# Patient Record
Sex: Male | Born: 1950 | Race: White | Marital: Married | Smoking: Former smoker
Health system: Southern US, Community
[De-identification: ages and names within clinical notes are randomized; demographics above are authoritative.]

## PROBLEM LIST (undated history)

## (undated) DIAGNOSIS — Z87442 Personal history of urinary calculi: Secondary | ICD-10-CM

## (undated) DIAGNOSIS — E7800 Pure hypercholesterolemia, unspecified: Secondary | ICD-10-CM

## (undated) DIAGNOSIS — G709 Myoneural disorder, unspecified: Secondary | ICD-10-CM

## (undated) DIAGNOSIS — K219 Gastro-esophageal reflux disease without esophagitis: Secondary | ICD-10-CM

## (undated) DIAGNOSIS — M1990 Unspecified osteoarthritis, unspecified site: Secondary | ICD-10-CM

## (undated) DIAGNOSIS — J189 Pneumonia, unspecified organism: Secondary | ICD-10-CM

## (undated) HISTORY — PX: BACK SURGERY: SHX140

## (undated) HISTORY — PX: ESOPHAGEAL RECONSTRUCTION: SHX1527

## (undated) HISTORY — PX: TONSILLECTOMY: SUR1361

## (undated) HISTORY — PX: ANTERIOR CERVICAL DECOMP/DISCECTOMY FUSION: SHX1161

---

## 2017-05-25 ENCOUNTER — Other Ambulatory Visit: Payer: Self-pay | Admitting: Orthopaedic Surgery

## 2017-05-25 DIAGNOSIS — M545 Low back pain: Secondary | ICD-10-CM

## 2017-06-02 ENCOUNTER — Other Ambulatory Visit: Payer: Self-pay | Admitting: Orthopaedic Surgery

## 2017-06-02 ENCOUNTER — Ambulatory Visit
Admission: RE | Admit: 2017-06-02 | Discharge: 2017-06-02 | Disposition: A | Discharge status: Home / Self Care | Payer: Medicare Other | Source: Ambulatory Visit | Attending: Orthopaedic Surgery | Admitting: Orthopaedic Surgery

## 2017-06-02 DIAGNOSIS — M545 Low back pain: Secondary | ICD-10-CM

## 2018-08-21 ENCOUNTER — Emergency Department (HOSPITAL_BASED_OUTPATIENT_CLINIC_OR_DEPARTMENT_OTHER)
Admission: EM | Admit: 2018-08-21 | Discharge: 2018-08-21 | Disposition: A | Discharge status: Home / Self Care | Payer: Medicare Other | Attending: Emergency Medicine | Admitting: Emergency Medicine

## 2018-08-21 ENCOUNTER — Encounter (HOSPITAL_BASED_OUTPATIENT_CLINIC_OR_DEPARTMENT_OTHER): Payer: Self-pay | Admitting: *Deleted

## 2018-08-21 ENCOUNTER — Emergency Department (HOSPITAL_BASED_OUTPATIENT_CLINIC_OR_DEPARTMENT_OTHER): Payer: Medicare Other

## 2018-08-21 ENCOUNTER — Other Ambulatory Visit: Payer: Self-pay

## 2018-08-21 DIAGNOSIS — N201 Calculus of ureter: Secondary | ICD-10-CM | POA: Diagnosis not present

## 2018-08-21 DIAGNOSIS — R109 Unspecified abdominal pain: Secondary | ICD-10-CM | POA: Diagnosis present

## 2018-08-21 DIAGNOSIS — R112 Nausea with vomiting, unspecified: Secondary | ICD-10-CM | POA: Diagnosis not present

## 2018-08-21 DIAGNOSIS — Z79899 Other long term (current) drug therapy: Secondary | ICD-10-CM | POA: Diagnosis not present

## 2018-08-21 HISTORY — DX: Pure hypercholesterolemia, unspecified: E78.00

## 2018-08-21 LAB — BASIC METABOLIC PANEL
Anion gap: 11 (ref 5–15)
BUN: 21 mg/dL (ref 8–23)
CHLORIDE: 104 mmol/L (ref 98–111)
CO2: 25 mmol/L (ref 22–32)
Calcium: 8.9 mg/dL (ref 8.9–10.3)
Creatinine, Ser: 1.28 mg/dL — ABNORMAL HIGH (ref 0.61–1.24)
GFR calc Af Amer: >60 mL/min (ref >60)
GFR, EST NON AFRICAN AMERICAN: 56 mL/min — AB (ref >60)
GLUCOSE: 136 mg/dL — AB (ref 70–99)
POTASSIUM: 3.9 mmol/L (ref 3.5–5.1)
Sodium: 140 mmol/L (ref 135–145)

## 2018-08-21 LAB — CBC WITH DIFFERENTIAL/PLATELET
Basophils Absolute: 0 10*3/uL (ref 0.0–0.1)
Basophils Relative: 0 %
EOS PCT: 1 %
Eosinophils Absolute: 0.1 10*3/uL (ref 0.0–0.7)
HCT: 46.8 % (ref 39.0–52.0)
Hemoglobin: 15.6 g/dL (ref 13.0–17.0)
LYMPHS PCT: 12 %
Lymphs Abs: 1.3 10*3/uL (ref 0.7–4.0)
MCH: 30.8 pg (ref 26.0–34.0)
MCHC: 33.3 g/dL (ref 30.0–36.0)
MCV: 92.3 fL (ref 78.0–100.0)
MONO ABS: 0.8 10*3/uL (ref 0.1–1.0)
MONOS PCT: 7 %
Neutro Abs: 8.9 10*3/uL — ABNORMAL HIGH (ref 1.7–7.7)
Neutrophils Relative %: 80 %
PLATELETS: 273 10*3/uL (ref 150–400)
RBC: 5.07 MIL/uL (ref 4.22–5.81)
RDW: 13.7 % (ref 11.5–15.5)
WBC: 11.1 10*3/uL — ABNORMAL HIGH (ref 4.0–10.5)

## 2018-08-21 LAB — URINALYSIS, ROUTINE W REFLEX MICROSCOPIC
BILIRUBIN URINE: NEGATIVE
Glucose, UA: NEGATIVE mg/dL
KETONES UR: NEGATIVE mg/dL
NITRITE: NEGATIVE
PROTEIN: NEGATIVE mg/dL
Specific Gravity, Urine: >1.03 — ABNORMAL HIGH (ref 1.005–1.030)
pH: 5 (ref 5.0–8.0)

## 2018-08-21 LAB — URINALYSIS, MICROSCOPIC (REFLEX)

## 2018-08-21 MED ORDER — ONDANSETRON HCL 4 MG/2ML IJ SOLN
4.0000 mg | Freq: Once | INTRAMUSCULAR | Status: AC
  Administered 2018-08-21: 4 mg via INTRAVENOUS
  Filled 2018-08-21: qty 2

## 2018-08-21 MED ORDER — HYDROMORPHONE HCL 1 MG/ML IJ SOLN
1.0000 mg | Freq: Once | INTRAMUSCULAR | Status: AC
  Administered 2018-08-21 (×2): 1 mg via INTRAVENOUS
  Filled 2018-08-21: qty 1

## 2018-08-21 MED ORDER — SODIUM CHLORIDE 0.9 % IV BOLUS
1000.0000 mL | Freq: Once | INTRAVENOUS | Status: AC
  Administered 2018-08-21: 1000 mL via INTRAVENOUS

## 2018-08-21 MED ORDER — TAMSULOSIN HCL 0.4 MG PO CAPS: 0.4000 mg | ORAL_CAPSULE | Freq: Every day | ORAL | 0 refills | Status: DC

## 2018-08-21 MED ORDER — HYDROMORPHONE HCL 1 MG/ML IJ SOLN: 1.0000 mg | Freq: Once | INTRAMUSCULAR | Status: DC

## 2018-08-21 MED ORDER — ONDANSETRON 4 MG PO TBDP: 4.0000 mg | ORAL_TABLET | Freq: Three times a day (TID) | ORAL | 0 refills | Status: DC | PRN

## 2018-08-21 MED ORDER — KETOROLAC TROMETHAMINE 30 MG/ML IJ SOLN
30.0000 mg | Freq: Once | INTRAMUSCULAR | Status: AC
  Administered 2018-08-21: 30 mg via INTRAVENOUS
  Filled 2018-08-21: qty 1

## 2018-08-21 MED ORDER — OXYCODONE-ACETAMINOPHEN 5-325 MG PO TABS: 1.0000 | ORAL_TABLET | ORAL | 0 refills | Status: DC | PRN

## 2018-08-21 MED ORDER — HYDROMORPHONE HCL 1 MG/ML IJ SOLN
INTRAMUSCULAR | Status: AC
  Administered 2018-08-21: 1 mg via INTRAVENOUS
  Filled 2018-08-21: qty 1

## 2018-08-21 NOTE — ED Notes (Signed)
ED Provider at bedside. 

## 2018-08-21 NOTE — ED Triage Notes (Signed)
Left flank pain. Pale. He feels like he has a kidney stone.

## 2018-08-21 NOTE — ED Provider Notes (Signed)
[facility] EMERGENCY DEPARTMENT Provider Note   CSN: [id number] Arrival date & time: 08/21/18  1147     History   Chief Complaint Chief Complaint  Patient presents with  . Flank Pain    HPI Adrian Byrd is a 67 y.o. male.  Pt presents to the ED today with a sudden onset of left flank pain.  Pt has never had a kidney stone, but thinks he may have one now.  He said pain radiates to his left groin.  N/v.     Past Medical History:  Diagnosis Date  . High cholesterol     There are no active problems to display for this patient.   History reviewed. No pertinent surgical history.      Home Medications    Prior to Admission medications   Medication Sig Start Date End Date Taking? Authorizing Provider  ATORVASTATIN CALCIUM PO Take by mouth.   Yes [provider]  BACLOFEN PO Take by mouth.   Yes [provider]  GABAPENTIN PO Take by mouth.   Yes [provider]  Omeprazole (PRILOSEC PO) Take by mouth.   Yes [provider]  ondansetron (ZOFRAN ODT) 4 MG disintegrating tablet Take 1 tablet (4 mg total) by mouth every 8 (eight) hours as needed. 08/21/18   Jacalyn Lefevre, MD  oxyCODONE-acetaminophen (PERCOCET/ROXICET) 5-325 MG tablet Take 1-2 tablets by mouth every 4 (four) hours as needed for severe pain. 08/21/18   Jacalyn Lefevre, MD  tamsulosin (FLOMAX) 0.4 MG CAPS capsule Take 1 capsule (0.4 mg total) by mouth daily. 08/21/18   Jacalyn Lefevre, MD    Family History No family history on file.  Social History Social History   Tobacco Use  . Smoking status: Not on file  Substance Use Topics  . Alcohol use: Not on file  . Drug use: Not on file     Allergies   Patient has no known allergies.   Review of Systems Review of Systems  Gastrointestinal: Positive for nausea and vomiting.  Genitourinary: Positive for flank pain.  All other systems reviewed and are negative.    Physical Exam Updated Vital Signs BP  98/72 (BP Location: Right Arm)   Pulse 64   Temp 97.7 F (36.5 C) (Oral)   Resp 20   Ht 6' (1.829 m)   Wt 111.1 kg   SpO2 95%   BMI 33.23 kg/m   Physical Exam  Constitutional: He is oriented to person, place, and time. He appears well-developed. He appears distressed.  Pt writhing on bed appearing very uncomfortable.  HENT:  Head: Normocephalic and atraumatic.  Right Ear: External ear normal.  Left Ear: External ear normal.  Nose: Nose normal.  Mouth/Throat: Oropharynx is clear and moist.  Eyes: Pupils are equal, round, and reactive to light. Conjunctivae and EOM are normal.  Neck: Normal range of motion. Neck supple.  Cardiovascular: Normal rate, regular rhythm, normal heart sounds and intact distal pulses.  Pulmonary/Chest: Effort normal and breath sounds normal.  Abdominal: Soft. Bowel sounds are normal.  Musculoskeletal: Normal range of motion.  Neurological: He is alert and oriented to person, place, and time.  Skin: Skin is warm. Capillary refill takes less than 2 seconds.  Psychiatric: He has a normal mood and affect. His behavior is normal. Judgment and thought content normal.  Nursing note and vitals reviewed.    ED Treatments / Results  Labs (all labs ordered are listed, but only abnormal results are displayed) Labs Reviewed  URINALYSIS, ROUTINE  W REFLEX MICROSCOPIC - Abnormal; Notable for the following components:      Result Value   APPearance HAZY (*)    Specific Gravity, Urine >1.030 (*)    Hgb urine dipstick TRACE (*)    Leukocytes, UA TRACE (*)    All other components within normal limits  BASIC METABOLIC PANEL - Abnormal; Notable for the following components:   Glucose, Bld 136 (*)    Creatinine, Ser 1.28 (*)    GFR calc non Af Amer 56 (*)    All other components within normal limits  CBC WITH DIFFERENTIAL/PLATELET - Abnormal; Notable for the following components:   WBC 11.1 (*)    Neutro Abs 8.9 (*)    All other components within normal limits    URINALYSIS, MICROSCOPIC (REFLEX) - Abnormal; Notable for the following components:   Bacteria, UA FEW (*)    All other components within normal limits    EKG None  Radiology Ct Renal Stone Study  Result Date: 08/21/2018 CLINICAL DATA:  Left flank and groin pain. EXAM: CT ABDOMEN AND PELVIS WITHOUT CONTRAST TECHNIQUE: Multidetector CT imaging of the abdomen and pelvis was performed following the standard protocol without IV contrast. COMPARISON:  None. FINDINGS: Lower chest: No acute abnormality. Hepatobiliary: No focal liver abnormality is seen. No gallstones, gallbladder wall thickening, or biliary dilatation. Pancreas: Unremarkable. No pancreatic ductal dilatation or surrounding inflammatory changes. Spleen: Normal in size without focal abnormality. Adrenals/Urinary Tract: There is a 5 mm stone obstructing the distal left ureter at the ureterovesical junction creating slight left hydronephrosis with left perinephric soft tissue stranding. There is a 2 mm stone in the distal left ureter approximately 15 mm proximal to the more distal stones. There is a 13 mm stone in the lower pole of the left kidney. 2 mm stone in the lower pole of the right kidney. 8 mm stone in the proximal right ureter creating mild right hydronephrosis. Stomach/Bowel: Multiple diverticula in the left side of the colon without diverticulitis. Small paraesophageal hiatal hernia. 10 mm lymph node adjacent to the distal esophagus on image 10 of series 2, nonspecific. Vascular/Lymphatic: Aortic atherosclerosis. No enlarged lymph nodes in the abdomen or pelvis. Reproductive: Prostate is unremarkable. Other: No abdominal wall hernia or abnormality. No abdominopelvic ascites. Musculoskeletal: No acute abnormalities. Multilevel degenerative disc disease in the lumbar spine. Osteophytes fuse multiple levels in the lower thoracic and upper lumbar spine. IMPRESSION: 1. 5 mm stone obstructing the distal left ureter at the left ureterovesical  junction. 2. 2 mm stone in the distal left ureter just proximal to the more distal stones. 3. 13 mm stone in the lower pole the left kidney. 4. 8 mm stone partially obstructing the proximal right ureter creating mild right hydronephrosis. 5. 2 mm stone in the lower pole of the right kidney. 6.  Aortic Atherosclerosis (ICD10-I70.0). 7. Small paraesophageal hernia with an adjacent slightly prominent lymph node of indeterminate etiology. Electronically Signed   By: Francene Boyers M.D.   On: 08/21/2018 12:54    Procedures Procedures (including critical care time)  Medications Ordered in ED Medications  HYDROmorphone (DILAUDID) injection 1 mg (1 mg Intravenous Not Given 08/21/18 1307)  HYDROmorphone (DILAUDID) injection 1 mg (1 mg Intravenous Given 08/21/18 1304)  ondansetron (ZOFRAN) injection 4 mg (4 mg Intravenous Given 08/21/18 1233)  sodium chloride 0.9 % bolus 1,000 mL (0 mLs Intravenous Stopped 08/21/18 1351)  ketorolac (TORADOL) 30 MG/ML injection 30 mg (30 mg Intravenous Given 08/21/18 1350)     Initial Impression /  Assessment and Plan / ED Course  I have reviewed the triage vital signs and the nursing notes.  Pertinent labs & imaging results that were available during my care of the patient were reviewed by me and considered in my medical decision making (see chart for details).    Pt is feeling much better.  Pain is gone after a total of 2 mg dilaudid and 30 mg toradol.  He is given the number of urology.  He is instructed to return if worse.   Final Clinical Impressions(s) / ED Diagnoses   Final diagnoses:  Ureterolithiasis    ED Discharge Orders         Ordered    oxyCODONE-acetaminophen (PERCOCET/ROXICET) 5-325 MG tablet  Every 4 hours PRN     08/21/18 1407    ondansetron (ZOFRAN ODT) 4 MG disintegrating tablet  Every 8 hours PRN     08/21/18 1407    tamsulosin (FLOMAX) 0.4 MG CAPS capsule  Daily     08/21/18 1407           Jacalyn Lefevre, MD 08/21/18 1409

## 2018-08-22 ENCOUNTER — Ambulatory Visit (HOSPITAL_COMMUNITY): Payer: Medicare Other | Admitting: Certified Registered Nurse Anesthetist

## 2018-08-22 ENCOUNTER — Encounter (HOSPITAL_COMMUNITY)
Admission: RE | Disposition: A | Discharge status: Home / Self Care | Payer: Self-pay | Source: Ambulatory Visit | Attending: Urology

## 2018-08-22 ENCOUNTER — Encounter (HOSPITAL_COMMUNITY): Payer: Self-pay | Admitting: *Deleted

## 2018-08-22 ENCOUNTER — Ambulatory Visit (HOSPITAL_COMMUNITY): Payer: Medicare Other

## 2018-08-22 ENCOUNTER — Ambulatory Visit (HOSPITAL_COMMUNITY)
Admission: RE | Admit: 2018-08-22 | Discharge: 2018-08-22 | Disposition: A | Discharge status: Home / Self Care | Payer: Medicare Other | Source: Ambulatory Visit | Attending: Urology | Admitting: Urology

## 2018-08-22 ENCOUNTER — Other Ambulatory Visit: Payer: Self-pay | Admitting: Urology

## 2018-08-22 DIAGNOSIS — E669 Obesity, unspecified: Secondary | ICD-10-CM | POA: Insufficient documentation

## 2018-08-22 DIAGNOSIS — E785 Hyperlipidemia, unspecified: Secondary | ICD-10-CM | POA: Diagnosis not present

## 2018-08-22 DIAGNOSIS — M545 Low back pain: Secondary | ICD-10-CM | POA: Insufficient documentation

## 2018-08-22 DIAGNOSIS — Z87891 Personal history of nicotine dependence: Secondary | ICD-10-CM | POA: Insufficient documentation

## 2018-08-22 DIAGNOSIS — Z825 Family history of asthma and other chronic lower respiratory diseases: Secondary | ICD-10-CM | POA: Diagnosis not present

## 2018-08-22 DIAGNOSIS — Z6832 Body mass index (BMI) 32.0-32.9, adult: Secondary | ICD-10-CM | POA: Insufficient documentation

## 2018-08-22 DIAGNOSIS — Z79899 Other long term (current) drug therapy: Secondary | ICD-10-CM | POA: Diagnosis not present

## 2018-08-22 DIAGNOSIS — G629 Polyneuropathy, unspecified: Secondary | ICD-10-CM | POA: Diagnosis not present

## 2018-08-22 DIAGNOSIS — N132 Hydronephrosis with renal and ureteral calculous obstruction: Secondary | ICD-10-CM | POA: Insufficient documentation

## 2018-08-22 DIAGNOSIS — G8929 Other chronic pain: Secondary | ICD-10-CM | POA: Insufficient documentation

## 2018-08-22 DIAGNOSIS — K219 Gastro-esophageal reflux disease without esophagitis: Secondary | ICD-10-CM | POA: Diagnosis not present

## 2018-08-22 HISTORY — DX: Gastro-esophageal reflux disease without esophagitis: K21.9

## 2018-08-22 HISTORY — PX: CYSTOSCOPY/RETROGRADE/URETEROSCOPY: SHX5316

## 2018-08-22 SURGERY — CYSTOSCOPY/RETROGRADE/URETEROSCOPY
Anesthesia: General | Laterality: Bilateral

## 2018-08-22 MED ORDER — HYDROCODONE-ACETAMINOPHEN 7.5-325 MG PO TABS: 1.0000 | ORAL_TABLET | Freq: Once | ORAL | Status: DC | PRN

## 2018-08-22 MED ORDER — CEFAZOLIN SODIUM-DEXTROSE 2-3 GM-%(50ML) IV SOLR
INTRAVENOUS | Status: DC | PRN
  Administered 2018-08-22: 2 g via INTRAVENOUS

## 2018-08-22 MED ORDER — DEXAMETHASONE SODIUM PHOSPHATE 4 MG/ML IJ SOLN
INTRAMUSCULAR | Status: DC | PRN
  Administered 2018-08-22: 10 mg via INTRAVENOUS

## 2018-08-22 MED ORDER — DEXAMETHASONE SODIUM PHOSPHATE 10 MG/ML IJ SOLN
INTRAMUSCULAR | Status: AC
  Filled 2018-08-22: qty 1

## 2018-08-22 MED ORDER — FENTANYL CITRATE (PF) 100 MCG/2ML IJ SOLN
INTRAMUSCULAR | Status: DC | PRN
  Administered 2018-08-22: 25 ug via INTRAVENOUS
  Administered 2018-08-22: 50 ug via INTRAVENOUS
  Administered 2018-08-22: 25 ug via INTRAVENOUS

## 2018-08-22 MED ORDER — SODIUM CHLORIDE 0.9 % IR SOLN
Status: DC | PRN
  Administered 2018-08-22: 3000 mL

## 2018-08-22 MED ORDER — LIDOCAINE 2% (20 MG/ML) 5 ML SYRINGE
INTRAMUSCULAR | Status: AC
  Filled 2018-08-22: qty 5

## 2018-08-22 MED ORDER — IOHEXOL 300 MG/ML  SOLN
INTRAMUSCULAR | Status: DC | PRN
  Administered 2018-08-22: 15 mL via URETHRAL

## 2018-08-22 MED ORDER — LACTATED RINGERS IV SOLN
INTRAVENOUS | Status: DC
  Administered 2018-08-22: 15:00:00 via INTRAVENOUS

## 2018-08-22 MED ORDER — ONDANSETRON HCL 4 MG/2ML IJ SOLN: 4.0000 mg | Freq: Once | INTRAMUSCULAR | Status: DC | PRN

## 2018-08-22 MED ORDER — MEPERIDINE HCL 50 MG/ML IJ SOLN: 6.2500 mg | INTRAMUSCULAR | Status: DC | PRN

## 2018-08-22 MED ORDER — PROPOFOL 10 MG/ML IV BOLUS
INTRAVENOUS | Status: AC
  Filled 2018-08-22: qty 40

## 2018-08-22 MED ORDER — LIDOCAINE 2% (20 MG/ML) 5 ML SYRINGE
INTRAMUSCULAR | Status: DC | PRN
  Administered 2018-08-22: 60 mg via INTRAVENOUS

## 2018-08-22 MED ORDER — ONDANSETRON HCL 4 MG/2ML IJ SOLN
INTRAMUSCULAR | Status: AC
  Filled 2018-08-22: qty 2

## 2018-08-22 MED ORDER — MIDAZOLAM HCL 2 MG/2ML IJ SOLN
INTRAMUSCULAR | Status: DC | PRN
  Administered 2018-08-22: 2 mg via INTRAVENOUS

## 2018-08-22 MED ORDER — HYDROMORPHONE HCL 1 MG/ML IJ SOLN: 0.2500 mg | INTRAMUSCULAR | Status: DC | PRN

## 2018-08-22 MED ORDER — CEFAZOLIN SODIUM-DEXTROSE 2-4 GM/100ML-% IV SOLN
INTRAVENOUS | Status: AC
  Filled 2018-08-22: qty 100

## 2018-08-22 MED ORDER — FENTANYL CITRATE (PF) 100 MCG/2ML IJ SOLN
INTRAMUSCULAR | Status: AC
  Filled 2018-08-22: qty 2

## 2018-08-22 MED ORDER — ONDANSETRON HCL 4 MG/2ML IJ SOLN
INTRAMUSCULAR | Status: DC | PRN
  Administered 2018-08-22: 4 mg via INTRAVENOUS

## 2018-08-22 MED ORDER — MIDAZOLAM HCL 2 MG/2ML IJ SOLN
INTRAMUSCULAR | Status: AC
  Filled 2018-08-22: qty 2

## 2018-08-22 MED ORDER — PROPOFOL 10 MG/ML IV BOLUS
INTRAVENOUS | Status: DC | PRN
  Administered 2018-08-22: 150 mg via INTRAVENOUS

## 2018-08-22 SURGICAL SUPPLY — 24 items
BAG URO CATCHER STRL LF (MISCELLANEOUS) ×2 IMPLANT
BASKET LASER NITINOL 1.9FR (BASKET) IMPLANT
BASKET ZERO TIP NITINOL 2.4FR (BASKET) IMPLANT
CATH INTERMIT  6FR 70CM (CATHETERS) ×2 IMPLANT
CATH URET 5FR 28IN CONE TIP (BALLOONS)
CATH URET 5FR 70CM CONE TIP (BALLOONS) IMPLANT
CLOTH BEACON ORANGE TIMEOUT ST (SAFETY) ×2 IMPLANT
EXTRACTOR STONE 1.7FRX115CM (UROLOGICAL SUPPLIES) IMPLANT
FIBER LASER FLEXIVA 365 (UROLOGICAL SUPPLIES) IMPLANT
FIBER LASER TRAC TIP (UROLOGICAL SUPPLIES) ×2 IMPLANT
GLOVE BIO SURGEON STRL SZ7.5 (GLOVE) ×2 IMPLANT
GLOVE BIOGEL M STRL SZ7.5 (GLOVE) ×2 IMPLANT
GLOVE BIOGEL PI IND STRL 7.5 (GLOVE) ×1 IMPLANT
GLOVE BIOGEL PI INDICATOR 7.5 (GLOVE) ×1
GOWN STRL REUS W/TWL XL LVL3 (GOWN DISPOSABLE) ×4 IMPLANT
GUIDEWIRE ANG ZIPWIRE 038X150 (WIRE) IMPLANT
GUIDEWIRE STR DUAL SENSOR (WIRE) ×2 IMPLANT
INFUSOR MANOMETER BAG 3000ML (MISCELLANEOUS) IMPLANT
MANIFOLD NEPTUNE II (INSTRUMENTS) ×2 IMPLANT
PACK CYSTO (CUSTOM PROCEDURE TRAY) ×2 IMPLANT
SHEATH URETERAL 12FRX28CM (UROLOGICAL SUPPLIES) IMPLANT
SHEATH URETERAL 12FRX35CM (MISCELLANEOUS) IMPLANT
STENT URET 6FRX26 CONTOUR (STENTS) ×2 IMPLANT
TUBING UROLOGY SET (TUBING) ×2 IMPLANT

## 2018-08-22 NOTE — Op Note (Signed)
Operative Note  Preoperative diagnosis:  1.  Bilateral ureteral calculi  Postoperative diagnosis: 1.  Left renal calculus, right ureteral calculus  Procedure(s): 1.  Cystoscopy 2.  Bilateral retrograde pyelogram 3.  Left diagnostic ureteroscopy 4.  Right ureteroscopy with laser lithotripsy and ureteral stent placement  Surgeon: Modena Slater, MD  Assistants: None  Anesthesia: General  Complications: None immediate  EBL: Minimal  Specimens: 1.  None  Drains/Catheters: 1.  6 x 26 double-J ureteral stent on the right  Intraoperative findings: 1.  Normal urethra and bladder 2.  No obvious ureteral calculi on the left.  Fluoroscopy revealed a left lower pole renal calculus.  Location was confirmed by retrograde pyelogram. 3.  Right 8 mm ureteral calculus with surrounding inflammation.  This was fragmented to smaller fragments.  Right retrograde pyelogram revealed some upstream hydronephrosis.  Indication: 67 year old male seen in the emergency department yesterday found to have bilateral ureteral stones elected to undergo the above operation.  Description of procedure:  The patient was identified and consent was obtained.  The patient was taken to the operating room and placed in the supine position.  The patient was placed under general anesthesia.  Perioperative antibiotics were administered.  The patient was placed in dorsal lithotomy.  Patient was prepped and draped in a standard sterile fashion and a timeout was performed.  A 21 French rigid cystoscope was advanced into the urethra and into the bladder.  Complete cystoscopy was performed.  The left ureter was cannulated with a sensor wire which was advanced up to the kidney under fluoroscopic guidance.  A semirigid ureteroscope was advanced atraumatically alongside the wire up the left ureter and into the renal pelvis.  There were no ureteral calculi seen.  I shot a retrograde pyelogram through the scope with the findings noted  above.  I then withdrew the scope.  I remove the wire and then advanced it through the ureteroscope up the right ureter under fluoroscopic guidance into the kidney.  I then readvanced the semirigid ureteroscope alongside the wire up to the stone of interest which was fragmented with a laser fiber to smaller fragments.  I then shot a retrograde pyelogram with the findings noted above.  I then withdrew the scope and backloaded the wire onto a rigid cystoscope which was advanced into the bladder.  A 6 x 26 double-J ureteral stent was then placed in a standard fashion followed by removal of the wire.  Fluoroscopy confirmed proximal placement and direct visualization confirmed a good coil within the bladder.  I drained the bladder and withdrew the scope.  This concluded the operation.  The patient tolerated the procedure well and was stable postoperatively.  Plan: Return in 1 week for ureteral stent removal.  We will then discuss management of the left lower pole calculus which includes ureteroscopy versus ESWL.

## 2018-08-22 NOTE — Transfer of Care (Signed)
Immediate Anesthesia Transfer of Care Note  Patient: Adrian Byrd  Procedure(s) Performed: CYSTOSCOPY/RETROGRADE/BILATERAL URETEROSCOPY AND LASER/RIGHT URETERAL STENT PLACEMENT (Bilateral )  Patient Location: PACU  Anesthesia Type:General  Level of Consciousness: drowsy  Airway & Oxygen Therapy: Patient Spontanous Breathing and Patient connected to face mask  Post-op Assessment: Report given to RN and Post -op Vital signs reviewed and stable  Post vital signs: Reviewed and stable  Last Vitals:  Vitals Value Taken Time  BP    Temp    Pulse 64 08/22/2018  5:23 PM  Resp 6 08/22/2018  5:23 PM  SpO2 100 % 08/22/2018  5:23 PM  Vitals shown include unvalidated device data.  Last Pain:  Vitals:   08/22/18 1442  TempSrc: Oral         Complications: No apparent anesthesia complications

## 2018-08-22 NOTE — Anesthesia Preprocedure Evaluation (Addendum)
Anesthesia Evaluation  Patient identified by MRN, date of birth, ID band Patient awake    Reviewed: Allergy & Precautions, NPO status , Patient's Chart, lab work & pertinent test results  Airway Mallampati: II  TM Distance: >3 FB Neck ROM: Full    Dental  (+) Edentulous Lower, Upper Dentures   Pulmonary neg pulmonary ROS, former smoker,    Pulmonary exam normal breath sounds clear to auscultation       Cardiovascular negative cardio ROS Normal cardiovascular exam Rhythm:Regular Rate:Normal     Neuro/Psych Neuropathy negative psych ROS   GI/Hepatic Neg liver ROS, GERD  Medicated and Controlled,  Endo/Other  Hyperlipidemia Obesity  Renal/GU Renal InsufficiencyRenal diseaseBilateral ureteral calculi  negative genitourinary   Musculoskeletal Chronic low back pain    Abdominal (+) + obese,   Peds  Hematology negative hematology ROS (+)   Anesthesia Other Findings   Reproductive/Obstetrics                          Anesthesia Physical Anesthesia Plan  ASA: II  Anesthesia Plan: General   Post-op Pain Management:    Induction: Intravenous  PONV Risk Score and Plan: 4 or greater and Midazolam, Ondansetron, Dexamethasone and Treatment may vary due to age or medical condition  Airway Management Planned: LMA  Additional Equipment:   Intra-op Plan:   Post-operative Plan: Extubation in OR  Informed Consent: I have reviewed the patients History and Physical, chart, labs and discussed the procedure including the risks, benefits and alternatives for the proposed anesthesia with the patient or authorized representative who has indicated his/her understanding and acceptance.   Dental advisory given  Plan Discussed with: CRNA and Surgeon  Anesthesia Plan Comments:         Anesthesia Quick Evaluation

## 2018-08-22 NOTE — Discharge Instructions (Signed)

## 2018-08-22 NOTE — H&P (Signed)
CC: I have kidney stones.  HPI: Adrian Byrd is a 67 year-old male patient who was referred by Betsey Holiday, PA who is here for renal calculi.  The problem is on both sides. He first stated noticing pain on 08/21/2018. This is his first kidney stone. He is currently having flank pain and back pain. He denies having groin pain, nausea, vomiting, fever, and chills. He has not caught a stone in his urine strainer since his symptoms began.   He has never had surgical treatment for calculi in the past.   Patient went to the emergency department yesterday. He was found to have an 8 mm right proximal ureteral calculus and a 6 mm left distal ureteral calculus. He has upcoming back surgery coming up. His renal function was normal yesterday. He continues to make urine. He is not having much pain since getting pain medication. He also has a 13 mm renal calculus. This is on the left. It is nonobstructing.   ALLERGIES: None   MEDICATIONS: Atorvastatin Calcium 40 mg tablet  Baclofen  Gabapentin 300 mg capsule  Meloxicam 7.5 mg tablet    GU PSH: None   NON-GU PSH: None   GU PMH: None   NON-GU PMH: None   FAMILY HISTORY: 1 son - Other 3 daughters - Other copd - Mother   SOCIAL HISTORY: Marital Status: Married Preferred Language: English; Race: White Current Smoking Status: Patient does not smoke anymore. Has not smoked since 08/13/2012.   Tobacco Use Assessment Completed:  Used Tobacco in last 30 days?  Drinks 2 caffeinated drinks per day.   REVIEW OF SYSTEMS:    GU Review Male:   Patient reports frequent urination and get up at night to urinate. Patient denies hard to postpone urination, burning/ pain with urination, leakage of urine, stream starts and stops, trouble starting your stream, have to strain to urinate , erection problems, and penile pain.  Gastrointestinal (Upper):   Patient denies nausea, vomiting, and indigestion/ heartburn.  Gastrointestinal (Lower):   Patient denies  diarrhea and constipation.  Constitutional:   Patient denies fever, night sweats, weight loss, and fatigue.  Skin:   Patient denies skin rash/ lesion and itching.  Eyes:   Patient denies blurred vision and double vision.  Ears/ Nose/ Throat:   Patient denies sore throat and sinus problems.  Hematologic/Lymphatic:   Patient denies swollen glands and easy bruising.  Cardiovascular:   Patient reports leg swelling. Patient denies chest pains.  Respiratory:   Patient denies cough and shortness of breath.  Endocrine:   Patient denies excessive thirst.  Musculoskeletal:   Patient reports back pain. Patient denies joint pain.  Neurological:   Patient denies headaches and dizziness.  Psychologic:   Patient denies depression and anxiety.   Notes: hematuria   VITAL SIGNS:      08/22/2018 08:42 AM  Weight 240 lb / 108.86 kg  Height 72 in / 182.88 cm  BP 127/75 mmHg  Heart Rate 69 /min  Temperature 97.8 F / 36.5 C  BMI 32.5 kg/m   MULTI-SYSTEM PHYSICAL EXAMINATION:    Constitutional: Well-nourished. No physical deformities. Normally developed. Good grooming.  Respiratory: No labored breathing, no use of accessory muscles.   Cardiovascular: Normal temperature, normal extremity pulses, no swelling, no varicosities.  Skin: No paleness, no jaundice, no cyanosis. No lesion, no ulcer, no rash.  Neurologic / Psychiatric: Oriented to time, oriented to place, oriented to person. No depression, no anxiety, no agitation.  Gastrointestinal: No mass, no tenderness, no  rigidity, non obese abdomen.  Eyes: Normal conjunctivae. Normal eyelids.  Musculoskeletal: Normal gait and station of head and neck.    PAST DATA REVIEWED:  Source Of History:  Patient  Records Review:   Previous Patient Records  X-Ray Review: C.T. Abdomen/Pelvis: Reviewed Films. Reviewed Report. Discussed With Patient.    PROCEDURES:         Urinalysis w/Scope Dipstick Dipstick Cont'd Micro  Color: Yellow Bilirubin: Neg mg/dL WBC/hpf:  0 - 5/hpf  Appearance: Clear Ketones: Neg mg/dL RBC/hpf: 10 - 29/FAO  Specific Gravity: 1.025 Blood: 3+ ery/uL Bacteria: NS (Not Seen)  pH: 5.5 Protein: 1+ mg/dL Cystals: NS (Not Seen)  Glucose: Neg mg/dL Urobilinogen: 0.2 mg/dL Casts: NS (Not Seen)    Nitrites: Neg Trichomonas: Not Present    Leukocyte Esterase: Neg leu/uL Mucous: Present      Epithelial Cells: 0 - 5/hpf      Yeast: NS (Not Seen)      Sperm: Not Present   ASSESSMENT:      ICD-10 Details  1 GU:   Renal and ureteral calculus - N20.2    PLAN:          Document Letter(s):  Created for Patient: Clinical Summary        Notes:   Proceed to operating room today for bilateral ureteroscopy, laser lithotripsy, ureteral stent placement. He understands potential risks including but not limited to bleeding, infection, injury to surrounding structures including injury to the ureter, need for additional procedures.   Signed by Modena Slater, III, M.D. on 08/22/18 at 9:22 AM (EDT

## 2018-08-22 NOTE — Anesthesia Postprocedure Evaluation (Signed)
Anesthesia Post Note  Patient: Adrian Byrd  Procedure(s) Performed: CYSTOSCOPY/RETROGRADE/BILATERAL URETEROSCOPY AND LASER/RIGHT URETERAL STENT PLACEMENT (Bilateral )     Patient location during evaluation: PACU Anesthesia Type: General Level of consciousness: awake and alert Pain management: pain level controlled Vital Signs Assessment: post-procedure vital signs reviewed and stable Respiratory status: spontaneous breathing, nonlabored ventilation and respiratory function stable Cardiovascular status: blood pressure returned to baseline and stable Postop Assessment: no apparent nausea or vomiting Anesthetic complications: no    Last Vitals:  Vitals:   08/22/18 1730 08/22/18 1740  BP: (!) 141/79 131/72  Pulse: 66 64  Resp: 12 10  Temp:  36.7 C  SpO2: 91% 91%    Last Pain:  Vitals:   08/22/18 1740  TempSrc:   PainSc: 0-No pain                 Ajah Vanhoose A.

## 2018-08-22 NOTE — Anesthesia Procedure Notes (Signed)
Procedure Name: LMA Insertion Date/Time: 08/22/2018 4:39 PM Performed by: Vanessa Inchelium, CRNA Pre-anesthesia Checklist: Emergency Drugs available, Patient identified, Suction available and Patient being monitored Patient Re-evaluated:Patient Re-evaluated prior to induction Oxygen Delivery Method: Circle system utilized Preoxygenation: Pre-oxygenation with 100% oxygen Induction Type: IV induction Ventilation: Mask ventilation without difficulty LMA: LMA with gastric port inserted LMA Size: 5.0 Number of attempts: 1 Placement Confirmation: positive ETCO2 and breath sounds checked- equal and bilateral Tube secured with: Tape Dental Injury: Teeth and Oropharynx as per pre-operative assessment

## 2018-08-23 ENCOUNTER — Encounter (HOSPITAL_COMMUNITY): Payer: Self-pay | Admitting: Urology

## 2019-01-16 ENCOUNTER — Other Ambulatory Visit: Payer: Self-pay | Admitting: Orthopaedic Surgery

## 2019-01-17 ENCOUNTER — Other Ambulatory Visit: Payer: Self-pay | Admitting: Orthopaedic Surgery

## 2019-01-17 DIAGNOSIS — M4716 Other spondylosis with myelopathy, lumbar region: Secondary | ICD-10-CM

## 2019-01-21 ENCOUNTER — Ambulatory Visit
Admission: RE | Admit: 2019-01-21 | Discharge: 2019-01-21 | Disposition: A | Discharge status: Home / Self Care | Payer: Medicare Other | Source: Ambulatory Visit | Attending: Orthopaedic Surgery | Admitting: Orthopaedic Surgery

## 2019-01-21 DIAGNOSIS — M4716 Other spondylosis with myelopathy, lumbar region: Secondary | ICD-10-CM

## 2019-01-26 ENCOUNTER — Other Ambulatory Visit: Payer: Self-pay | Admitting: Neurosurgery

## 2019-01-26 DIAGNOSIS — M4807 Spinal stenosis, lumbosacral region: Secondary | ICD-10-CM

## 2019-02-01 ENCOUNTER — Ambulatory Visit
Admission: RE | Admit: 2019-02-01 | Discharge: 2019-02-01 | Disposition: A | Discharge status: Home / Self Care | Payer: Medicare Other | Source: Ambulatory Visit | Attending: Neurosurgery | Admitting: Neurosurgery

## 2019-02-01 DIAGNOSIS — M4807 Spinal stenosis, lumbosacral region: Secondary | ICD-10-CM

## 2019-02-01 MED ORDER — METHYLPREDNISOLONE ACETATE 40 MG/ML INJ SUSP (RADIOLOG
120.0000 mg | Freq: Once | INTRAMUSCULAR | Status: AC
  Administered 2019-02-01: 120 mg via EPIDURAL

## 2019-02-01 MED ORDER — IOPAMIDOL (ISOVUE-M 200) INJECTION 41%
1.0000 mL | Freq: Once | INTRAMUSCULAR | Status: AC
  Administered 2019-02-01: 1 mL via EPIDURAL

## 2019-02-01 NOTE — Discharge Instructions (Signed)

## 2019-02-13 ENCOUNTER — Ambulatory Visit (INDEPENDENT_AMBULATORY_CARE_PROVIDER_SITE_OTHER): Payer: Medicare Other | Admitting: Neurology

## 2019-02-13 ENCOUNTER — Encounter: Payer: Self-pay | Admitting: Neurology

## 2019-02-13 ENCOUNTER — Encounter

## 2019-02-13 DIAGNOSIS — M5432 Sciatica, left side: Secondary | ICD-10-CM | POA: Diagnosis not present

## 2019-02-13 NOTE — Procedures (Signed)
     HISTORY:  Adrian Byrd is a 68 year old white male with a history of lumbosacral spine surgery on 11 September 2018.  The patient had right-sided leg pain before the surgery, following the surgery he began having pain on the left side going from the back all the way down to the foot.  The patient has had epidural injections without significant benefit.  He is being evaluated for the ongoing left leg pain.  NERVE CONDUCTION STUDIES:  Nerve conduction studies were performed on both lower extremities.  The distal motor latencies for the peroneal and posterior tibial nerves were within normal limits bilaterally.  The motor amplitudes for the peroneal nerves were normal on the right and the low on the left.  The motor amplitudes for the posterior tibial nerves were normal on the right, low on the left.  Slowing was seen for the left peroneal and left posterior tibial nerves, normal on the right side.  The left sural sensory latency was unobtainable, and was normal for the left peroneal nerve.  The right peroneal and sural sensory latencies were normal.  The F-wave latencies for the posterior tibial nerves were prolonged bilaterally.  EMG STUDIES:  EMG study was performed on the left lower extremity:  The tibialis anterior muscle reveals 2 to 4K motor units with full recruitment. No fibrillations or positive waves were seen. The peroneus tertius muscle reveals 2 to 4K motor units with decreased recruitment. No fibrillations or positive waves were seen. The medial gastrocnemius muscle reveals 1 to 3K motor units with full recruitment. No fibrillations or positive waves were seen. The vastus lateralis muscle reveals 2 to 4K motor units with full recruitment. No fibrillations or positive waves were seen. The iliopsoas muscle reveals 2 to 4K motor units with full recruitment. No fibrillations or positive waves were seen. The biceps femoris muscle (long head) reveals 2 to 4K motor units with full  recruitment. No fibrillations or positive waves were seen. The lumbosacral paraspinal muscles were tested at 3 levels, and revealed no abnormalities of insertional activity at the upper level tested.  2+ positive waves were seen at the middle and lower levels.  There was good relaxation.   IMPRESSION:  Nerve conduction studies done on both lower extremities shows some slowing of the left peroneal and posterior tibial nerves, normal on the right.  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  EMG of the left leg did not show clear evidence of an overlying lumbosacral radiculopathy.  Marlan Palau MD 02/13/2019 1:36 PM  [facility] [address] [place], [place] [address]  Phone [phone] Fax [phone]

## 2019-02-13 NOTE — Progress Notes (Signed)
[facility]    Nerve / Sites Muscle Latency Ref. Amplitude Ref. Rel Amp Segments Distance Velocity Ref. Area    ms ms mV mV %  cm m/s m/s mVms  R Peroneal - EDB     Ankle EDB 4.4 ?6.5 3.0 ?2.0 100 Ankle - EDB 9   7.8     Fib head EDB 11.9  2.6  89 Fib head - Ankle 33 44 ?44 8.3     Pop fossa EDB 14.2  2.5  93.8 Pop fossa - Fib head 10 44 ?44 8.0         Pop fossa - Ankle      L Peroneal - EDB     Ankle EDB 3.9 ?6.5 1.7 ?2.0 100 Ankle - EDB 9   4.5     Fib head EDB 11.9  1.4  80.5 Fib head - Ankle 33 41 ?44 3.8     Pop fossa EDB 14.3  1.3  94.2 Pop fossa - Fib head 10 42 ?44 3.8         Pop fossa - Ankle      R Tibial - AH     Ankle AH 2.8 ?5.8 4.3 ?4.0 100 Ankle - AH 9   7.3     Pop fossa AH 13.2  2.0  47.8 Pop fossa - Ankle 42 41 ?41 4.9  L Tibial - AH     Ankle AH 3.2 ?5.8 1.3 ?4.0 100 Ankle - AH 9   4.7     Pop fossa AH 16.0  0.6  47.1 Pop fossa - Ankle 42 33 ?41 2.6             [facility]    Nerve / Sites Rec. Site Peak Lat Ref.  Amp Ref. Segments Distance    ms ms V V  cm  R Sural - Ankle (Calf)     Calf Ankle 3.4 ?4.4 6 ?6 Calf - Ankle 14  L Sural - Ankle (Calf)     Calf Ankle NR ?4.4 NR ?6 Calf - Ankle 14  R Superficial peroneal - Ankle     Lat leg Ankle 3.9 ?4.4 6 ?6 Lat leg - Ankle 14  L Superficial peroneal - Ankle     Lat leg Ankle 3.7 ?4.4 3 ?6 Lat leg - Ankle 14              F  Wave    Nerve F Lat Ref.   ms ms  R Tibial - AH 57.9 ?56.0  L Tibial - AH 60.0 ?56.0

## 2019-02-13 NOTE — Progress Notes (Signed)
Please refer to EMG and nerve conduction procedure note.  

## 2019-02-21 ENCOUNTER — Other Ambulatory Visit: Payer: Self-pay | Admitting: Orthopaedic Surgery

## 2019-02-21 DIAGNOSIS — M4716 Other spondylosis with myelopathy, lumbar region: Secondary | ICD-10-CM

## 2019-04-12 ENCOUNTER — Other Ambulatory Visit: Payer: Self-pay

## 2019-04-12 ENCOUNTER — Ambulatory Visit
Admission: RE | Admit: 2019-04-12 | Discharge: 2019-04-12 | Disposition: A | Discharge status: Home / Self Care | Payer: Medicare Other | Source: Ambulatory Visit | Attending: Orthopaedic Surgery | Admitting: Orthopaedic Surgery

## 2019-04-12 DIAGNOSIS — M4716 Other spondylosis with myelopathy, lumbar region: Secondary | ICD-10-CM

## 2019-06-05 ENCOUNTER — Other Ambulatory Visit: Payer: Self-pay | Admitting: Neurosurgery

## 2019-06-18 NOTE — Pre-Procedure Instructions (Addendum)
Sherlie BanDonald Scheel  06/18/2019     [id number] [place] [facility]- [place], [place] - [address] [place] [address] Phone: [phone] Fax: [phone]   Your procedure is scheduled on Wednesday, July 15th  Report to [facility] Entr[facility] A at 8:00 A.M.  Call this number if you have problems the morning of surgery:  [phone]   Remember:  Do not eat or drink after midnight on July 14th.     Take these medicines the morning of surgery with A SIP OF WATER  cyclobenzaprine (FLEXERIL) gabapentin (NEURONTIN) omeprazole (PRILOSEC)  If needed - acetaminophen (TYLENOL),   7 days prior to surgery STOP taking any Aspirin (unless otherwise instructed by your surgeon), Aleve, Naproxen, Ibuprofen, Motrin, Advil, Goody's, BC's, all herbal medications, fish oil, and all vitamins.  Follow your surgeon's instructions on when to stop Aspirin.  If no instructions were given by your surgeon then you will need to call the office to get those instructions.      Special instructions:   [place]- Preparing For Surgery  Before surgery, you can play an important role. Because skin is not sterile, your skin needs to be as free of germs as possible. You can reduce the number of germs on your skin by washing with CHG (chlorahexidine gluconate) Soap before surgery.  CHG is an antiseptic cleaner which kills germs and bonds with the skin to continue killing germs even after washing.    Oral Hygiene is also important to reduce your risk of infection.  Remember - BRUSH YOUR TEETH THE MORNING OF SURGERY WITH YOUR REGULAR TOOTHPASTE  Please do not use if you have an allergy to CHG or antibacterial soaps. If your skin becomes reddened/irritated stop using the CHG.  Do not shave (including legs and underarms) for at least 48 hours prior to first CHG shower. It is OK to shave your face.  Please follow these instructions carefully.   1. Shower the NIGHT BEFORE SURGERY and the MORNING OF SURGERY  with CHG.   2. If you chose to wash your hair, wash your hair first as usual with your normal shampoo.  3. After you shampoo, rinse your hair and body thoroughly to remove the shampoo.  4. Use CHG as you would any other liquid soap. You can apply CHG directly to the skin and wash gently with a scrungie or a clean washcloth.   5. Apply the CHG Soap to your body ONLY FROM THE NECK DOWN.  Do not use on open wounds or open sores. Avoid contact with your eyes, ears, mouth and genitals (private parts). Wash Face and genitals (private parts)  with your normal soap.  6. Wash thoroughly, paying special attention to the area where your surgery will be performed.  7. Thoroughly rinse your body with warm water from the neck down.  8. DO NOT shower/wash with your normal soap after using and rinsing off the CHG Soap.  9. Pat yourself dry with a CLEAN TOWEL.  10. Wear CLEAN PAJAMAS to bed the night before surgery, wear comfortable clothes the morning of surgery  11. Place CLEAN SHEETS on your bed the night of your first shower and DO NOT SLEEP WITH PETS.  Day of Surgery: Do not wear jewelry, make-up or nail polish.  Do not wear lotions, powders, or perfumes, or deodorant.  Do not shave 48 hours prior to surgery.  Men may shave neck and face.  Do not bring valuables to the hospital.  [facility] is  not responsible for any belongings or valuables.  Please wear clean clothes to the hospital/surgery center.   Remember to brush your teeth WITH YOUR REGULAR TOOTHPASTE.  Contacts, dentures or bridgework may not be worn into surgery.  Leave your suitcase in the car.  After surgery it may be brought to your room.  For patients admitted to the hospital, discharge time will be determined by your treatment team.  Patients discharged the day of surgery will not be allowed to drive home.   Please read over the following fact sheets that you were given. Pain Booklet, Coughing and Deep Breathing, MRSA  Information and Surgical Site Infection Prevention

## 2019-06-19 ENCOUNTER — Encounter (HOSPITAL_COMMUNITY)
Admission: RE | Admit: 2019-06-19 | Discharge: 2019-06-19 | Disposition: A | Discharge status: Home / Self Care | Payer: Medicare Other | Source: Ambulatory Visit | Attending: Neurosurgery | Admitting: Neurosurgery

## 2019-06-19 ENCOUNTER — Encounter (HOSPITAL_COMMUNITY): Payer: Self-pay

## 2019-06-19 ENCOUNTER — Other Ambulatory Visit: Payer: Self-pay

## 2019-06-19 DIAGNOSIS — T8484XA Pain due to internal orthopedic prosthetic devices, implants and grafts, initial encounter: Secondary | ICD-10-CM | POA: Insufficient documentation

## 2019-06-19 DIAGNOSIS — Z01812 Encounter for preprocedural laboratory examination: Secondary | ICD-10-CM | POA: Diagnosis not present

## 2019-06-19 HISTORY — DX: Pneumonia, unspecified organism: J18.9

## 2019-06-19 HISTORY — DX: Personal history of urinary calculi: Z87.442

## 2019-06-19 HISTORY — DX: Myoneural disorder, unspecified: G70.9

## 2019-06-19 LAB — BASIC METABOLIC PANEL
Anion gap: 10 (ref 5–15)
BUN: 16 mg/dL (ref 8–23)
CO2: 23 mmol/L (ref 22–32)
Calcium: 9.4 mg/dL (ref 8.9–10.3)
Chloride: 107 mmol/L (ref 98–111)
Creatinine, Ser: 1.01 mg/dL (ref 0.61–1.24)
GFR calc Af Amer: >60 mL/min (ref >60)
GFR calc non Af Amer: >60 mL/min (ref >60)
Glucose, Bld: 88 mg/dL (ref 70–99)
Potassium: 4.2 mmol/L (ref 3.5–5.1)
Sodium: 140 mmol/L (ref 135–145)

## 2019-06-19 LAB — SURGICAL PCR SCREEN
MRSA, PCR: NEGATIVE
Staphylococcus aureus: NEGATIVE

## 2019-06-19 LAB — CBC
HCT: 45.4 % (ref 39.0–52.0)
Hemoglobin: 14.8 g/dL (ref 13.0–17.0)
MCH: 30.3 pg (ref 26.0–34.0)
MCHC: 32.6 g/dL (ref 30.0–36.0)
MCV: 93 fL (ref 80.0–100.0)
Platelets: 303 10*3/uL (ref 150–400)
RBC: 4.88 MIL/uL (ref 4.22–5.81)
RDW: 12.7 % (ref 11.5–15.5)
WBC: 7.9 10*3/uL (ref 4.0–10.5)
nRBC: 0 % (ref 0.0–0.2)

## 2019-06-19 NOTE — Progress Notes (Signed)
PCP - Blair Heys PA-C Cardiologist - denies  Chest x-ray - denies EKG - denies Stress Test - per pt. "7 years ago, doctor said it was normal" ECHO - denies Cardiac Cath - denies  Sleep Study - denies CPAP - N/A  Blood Thinner Instructions: N/A Aspirin Instructions: N/A  Anesthesia review: No  Coronavirus Screening  Have you experienced the following symptoms:  Cough yes/no: No Fever (>100.54F)  yes/no: No Runny nose yes/no: No Sore throat yes/no: No Difficulty breathing/shortness of breath  yes/no: No  Have you or a family member traveled in the last 14 days and where? yes/no: No   If the patient indicates "YES" to the above questions, their PAT will be rescheduled to limit the exposure to others and, the surgeon will be notified. THE PATIENT WILL NEED TO BE ASYMPTOMATIC FOR 14 DAYS.   If the patient is not experiencing any of these symptoms, the PAT nurse will instruct them to NOT bring anyone with them to their appointment since they may have these symptoms or traveled as well.   Please remind your patients and families that hospital visitation restrictions are in effect and the importance of the restrictions.   Patient denies shortness of breath, fever, cough and chest pain at PAT appointment  Patient verbalized understanding of instructions that were given to them at the PAT appointment. Patient was also instructed that they will need to review over the PAT instructions again at home before surgery.

## 2019-06-23 ENCOUNTER — Other Ambulatory Visit (HOSPITAL_COMMUNITY)
Admission: RE | Admit: 2019-06-23 | Discharge: 2019-06-23 | Disposition: A | Discharge status: Home / Self Care | Payer: Medicare Other | Source: Ambulatory Visit | Attending: Neurosurgery | Admitting: Neurosurgery

## 2019-06-23 DIAGNOSIS — Z1159 Encounter for screening for other viral diseases: Secondary | ICD-10-CM | POA: Diagnosis not present

## 2019-06-23 DIAGNOSIS — Z01812 Encounter for preprocedural laboratory examination: Secondary | ICD-10-CM | POA: Diagnosis present

## 2019-06-23 LAB — SARS CORONAVIRUS 2 (TAT 6-24 HRS): SARS Coronavirus 2: NEGATIVE

## 2019-06-27 ENCOUNTER — Inpatient Hospital Stay (HOSPITAL_COMMUNITY): Payer: Medicare Other | Admitting: Anesthesiology

## 2019-06-27 ENCOUNTER — Inpatient Hospital Stay (HOSPITAL_COMMUNITY)
Admission: RE | Admit: 2019-06-27 | Discharge: 2019-06-27 | DRG: 497 | Disposition: A | Discharge status: Home / Self Care | Payer: Medicare Other | Attending: Neurosurgery | Admitting: Neurosurgery

## 2019-06-27 ENCOUNTER — Encounter (HOSPITAL_COMMUNITY)
Admission: RE | Disposition: A | Discharge status: Home / Self Care | Payer: Self-pay | Source: Home / Self Care | Attending: Neurosurgery

## 2019-06-27 ENCOUNTER — Other Ambulatory Visit: Payer: Self-pay

## 2019-06-27 ENCOUNTER — Encounter (HOSPITAL_COMMUNITY): Payer: Self-pay | Admitting: *Deleted

## 2019-06-27 DIAGNOSIS — K219 Gastro-esophageal reflux disease without esophagitis: Secondary | ICD-10-CM | POA: Diagnosis present

## 2019-06-27 DIAGNOSIS — Z87891 Personal history of nicotine dependence: Secondary | ICD-10-CM | POA: Diagnosis not present

## 2019-06-27 DIAGNOSIS — Z981 Arthrodesis status: Secondary | ICD-10-CM

## 2019-06-27 DIAGNOSIS — Y831 Surgical operation with implant of artificial internal device as the cause of abnormal reaction of the patient, or of later complication, without mention of misadventure at the time of the procedure: Secondary | ICD-10-CM | POA: Diagnosis present

## 2019-06-27 DIAGNOSIS — T8484XA Pain due to internal orthopedic prosthetic devices, implants and grafts, initial encounter: Secondary | ICD-10-CM | POA: Diagnosis present

## 2019-06-27 DIAGNOSIS — E669 Obesity, unspecified: Secondary | ICD-10-CM | POA: Diagnosis present

## 2019-06-27 DIAGNOSIS — E7800 Pure hypercholesterolemia, unspecified: Secondary | ICD-10-CM | POA: Diagnosis present

## 2019-06-27 DIAGNOSIS — T84226A Displacement of internal fixation device of vertebrae, initial encounter: Secondary | ICD-10-CM | POA: Diagnosis present

## 2019-06-27 DIAGNOSIS — Z6830 Body mass index (BMI) 30.0-30.9, adult: Secondary | ICD-10-CM

## 2019-06-27 HISTORY — PX: HARDWARE REMOVAL: SHX979

## 2019-06-27 SURGERY — REMOVAL, HARDWARE
Anesthesia: General | Site: Back

## 2019-06-27 MED ORDER — FENTANYL CITRATE (PF) 250 MCG/5ML IJ SOLN
INTRAMUSCULAR | Status: AC
  Filled 2019-06-27: qty 5

## 2019-06-27 MED ORDER — ONDANSETRON HCL 4 MG/2ML IJ SOLN
INTRAMUSCULAR | Status: AC
  Filled 2019-06-27: qty 2

## 2019-06-27 MED ORDER — ACETAMINOPHEN 500 MG PO TABS: 1000.0000 mg | ORAL_TABLET | Freq: Four times a day (QID) | ORAL | Status: DC | PRN

## 2019-06-27 MED ORDER — ACETAMINOPHEN 325 MG PO TABS: 650.0000 mg | ORAL_TABLET | ORAL | Status: DC | PRN

## 2019-06-27 MED ORDER — ATORVASTATIN CALCIUM 40 MG PO TABS: 40.0000 mg | ORAL_TABLET | Freq: Every day | ORAL | Status: DC

## 2019-06-27 MED ORDER — LIDOCAINE 2% (20 MG/ML) 5 ML SYRINGE
INTRAMUSCULAR | Status: AC
  Filled 2019-06-27: qty 5

## 2019-06-27 MED ORDER — BUPIVACAINE HCL (PF) 0.25 % IJ SOLN
INTRAMUSCULAR | Status: AC
  Filled 2019-06-27: qty 30

## 2019-06-27 MED ORDER — PHENYLEPHRINE 40 MCG/ML (10ML) SYRINGE FOR IV PUSH (FOR BLOOD PRESSURE SUPPORT)
PREFILLED_SYRINGE | INTRAVENOUS | Status: AC
  Filled 2019-06-27: qty 10

## 2019-06-27 MED ORDER — ONDANSETRON HCL 4 MG/2ML IJ SOLN: 4.0000 mg | Freq: Four times a day (QID) | INTRAMUSCULAR | Status: DC | PRN

## 2019-06-27 MED ORDER — DEXAMETHASONE SODIUM PHOSPHATE 10 MG/ML IJ SOLN
10.0000 mg | INTRAMUSCULAR | Status: AC
  Administered 2019-06-27: 10 mg via INTRAVENOUS
  Filled 2019-06-27: qty 1

## 2019-06-27 MED ORDER — MENTHOL 3 MG MT LOZG: 1.0000 | LOZENGE | OROMUCOSAL | Status: DC | PRN

## 2019-06-27 MED ORDER — DEXAMETHASONE SODIUM PHOSPHATE 10 MG/ML IJ SOLN
INTRAMUSCULAR | Status: AC
  Filled 2019-06-27: qty 1

## 2019-06-27 MED ORDER — PROPOFOL 10 MG/ML IV BOLUS
INTRAVENOUS | Status: AC
  Filled 2019-06-27: qty 20

## 2019-06-27 MED ORDER — 0.9 % SODIUM CHLORIDE (POUR BTL) OPTIME
TOPICAL | Status: DC | PRN
  Administered 2019-06-27: 1000 mL

## 2019-06-27 MED ORDER — LIDOCAINE-EPINEPHRINE 1 %-1:100000 IJ SOLN
INTRAMUSCULAR | Status: DC | PRN
  Administered 2019-06-27: 10 mL

## 2019-06-27 MED ORDER — SUGAMMADEX SODIUM 200 MG/2ML IV SOLN
INTRAVENOUS | Status: DC | PRN
  Administered 2019-06-27: 206 mg via INTRAVENOUS

## 2019-06-27 MED ORDER — SUCCINYLCHOLINE CHLORIDE 200 MG/10ML IV SOSY
PREFILLED_SYRINGE | INTRAVENOUS | Status: AC
  Filled 2019-06-27: qty 10

## 2019-06-27 MED ORDER — ONDANSETRON HCL 4 MG/2ML IJ SOLN
INTRAMUSCULAR | Status: DC | PRN
  Administered 2019-06-27: 4 mg via INTRAVENOUS

## 2019-06-27 MED ORDER — ROCURONIUM BROMIDE 10 MG/ML (PF) SYRINGE
PREFILLED_SYRINGE | INTRAVENOUS | Status: AC
  Filled 2019-06-27: qty 10

## 2019-06-27 MED ORDER — SODIUM CHLORIDE 0.9% FLUSH: 3.0000 mL | Freq: Two times a day (BID) | INTRAVENOUS | Status: DC

## 2019-06-27 MED ORDER — VITAMIN C 500 MG PO TABS: 500.0000 mg | ORAL_TABLET | Freq: Every day | ORAL | Status: DC

## 2019-06-27 MED ORDER — SODIUM CHLORIDE 0.9% FLUSH: 3.0000 mL | INTRAVENOUS | Status: DC | PRN

## 2019-06-27 MED ORDER — HEMOSTATIC AGENTS (NO CHARGE) OPTIME
TOPICAL | Status: DC | PRN
  Administered 2019-06-27: 1 via TOPICAL

## 2019-06-27 MED ORDER — TROLAMINE SALICYLATE 10 % EX CREA: 1.0000 "application " | TOPICAL_CREAM | CUTANEOUS | Status: DC | PRN

## 2019-06-27 MED ORDER — ACETAMINOPHEN 650 MG RE SUPP: 650.0000 mg | RECTAL | Status: DC | PRN

## 2019-06-27 MED ORDER — CEFAZOLIN SODIUM-DEXTROSE 2-4 GM/100ML-% IV SOLN
2.0000 g | INTRAVENOUS | Status: AC
  Administered 2019-06-27: 2 g via INTRAVENOUS
  Filled 2019-06-27: qty 100

## 2019-06-27 MED ORDER — OXYCODONE HCL 5 MG PO TABS: 10.0000 mg | ORAL_TABLET | ORAL | Status: DC | PRN

## 2019-06-27 MED ORDER — EPHEDRINE 5 MG/ML INJ
INTRAVENOUS | Status: AC
  Filled 2019-06-27: qty 10

## 2019-06-27 MED ORDER — GABAPENTIN 600 MG PO TABS
600.0000 mg | ORAL_TABLET | Freq: Three times a day (TID) | ORAL | Status: DC
  Administered 2019-06-27: 600 mg via ORAL
  Filled 2019-06-27: qty 1

## 2019-06-27 MED ORDER — LACTATED RINGERS IV SOLN
INTRAVENOUS | Status: DC
  Administered 2019-06-27 (×2): via INTRAVENOUS

## 2019-06-27 MED ORDER — PANTOPRAZOLE SODIUM 40 MG PO TBEC: 40.0000 mg | DELAYED_RELEASE_TABLET | Freq: Every day | ORAL | Status: DC

## 2019-06-27 MED ORDER — CHLORHEXIDINE GLUCONATE CLOTH 2 % EX PADS: 6.0000 | MEDICATED_PAD | Freq: Once | CUTANEOUS | Status: DC

## 2019-06-27 MED ORDER — ONDANSETRON HCL 4 MG PO TABS: 4.0000 mg | ORAL_TABLET | Freq: Four times a day (QID) | ORAL | Status: DC | PRN

## 2019-06-27 MED ORDER — PANTOPRAZOLE SODIUM 40 MG IV SOLR: 40.0000 mg | Freq: Every day | INTRAVENOUS | Status: DC

## 2019-06-27 MED ORDER — CEFAZOLIN SODIUM-DEXTROSE 2-4 GM/100ML-% IV SOLN: 2.0000 g | Freq: Three times a day (TID) | INTRAVENOUS | Status: DC

## 2019-06-27 MED ORDER — CYCLOBENZAPRINE HCL 10 MG PO TABS: 10.0000 mg | ORAL_TABLET | Freq: Three times a day (TID) | ORAL | Status: DC | PRN

## 2019-06-27 MED ORDER — THROMBIN 5000 UNITS EX SOLR
CUTANEOUS | Status: DC | PRN
  Administered 2019-06-27 (×2): 5000 [IU] via TOPICAL

## 2019-06-27 MED ORDER — HYDROMORPHONE HCL 1 MG/ML IJ SOLN: 0.5000 mg | INTRAMUSCULAR | Status: DC | PRN

## 2019-06-27 MED ORDER — CYCLOBENZAPRINE HCL 10 MG PO TABS: 10.0000 mg | ORAL_TABLET | Freq: Three times a day (TID) | ORAL | Status: DC

## 2019-06-27 MED ORDER — MIDAZOLAM HCL 5 MG/5ML IJ SOLN
INTRAMUSCULAR | Status: DC | PRN
  Administered 2019-06-27: 2 mg via INTRAVENOUS

## 2019-06-27 MED ORDER — SODIUM CHLORIDE 0.9 % IV SOLN
INTRAVENOUS | Status: DC | PRN
  Administered 2019-06-27: 500 mL

## 2019-06-27 MED ORDER — ALUM & MAG HYDROXIDE-SIMETH 200-200-20 MG/5ML PO SUSP: 30.0000 mL | Freq: Four times a day (QID) | ORAL | Status: DC | PRN

## 2019-06-27 MED ORDER — MUSCLE RUB 10-15 % EX CREA
1.0000 "application " | TOPICAL_CREAM | CUTANEOUS | Status: DC | PRN
  Filled 2019-06-27: qty 85

## 2019-06-27 MED ORDER — BUPIVACAINE LIPOSOME 1.3 % IJ SUSP
20.0000 mL | Freq: Once | INTRAMUSCULAR | Status: DC
  Filled 2019-06-27: qty 20

## 2019-06-27 MED ORDER — SODIUM CHLORIDE 0.9 % IV SOLN: 250.0000 mL | INTRAVENOUS | Status: DC

## 2019-06-27 MED ORDER — LIDOCAINE-EPINEPHRINE 1 %-1:100000 IJ SOLN
INTRAMUSCULAR | Status: AC
  Filled 2019-06-27: qty 1

## 2019-06-27 MED ORDER — FENTANYL CITRATE (PF) 250 MCG/5ML IJ SOLN
INTRAMUSCULAR | Status: DC | PRN
  Administered 2019-06-27: 250 ug via INTRAVENOUS

## 2019-06-27 MED ORDER — MIDAZOLAM HCL 2 MG/2ML IJ SOLN
INTRAMUSCULAR | Status: AC
  Filled 2019-06-27: qty 2

## 2019-06-27 MED ORDER — PHENOL 1.4 % MT LIQD: 1.0000 | OROMUCOSAL | Status: DC | PRN

## 2019-06-27 MED ORDER — THROMBIN 5000 UNITS EX SOLR
CUTANEOUS | Status: AC
  Filled 2019-06-27: qty 10000

## 2019-06-27 MED ORDER — BUPIVACAINE LIPOSOME 1.3 % IJ SUSP
INTRAMUSCULAR | Status: DC | PRN
  Administered 2019-06-27: 20 mL

## 2019-06-27 MED ORDER — PROPOFOL 10 MG/ML IV BOLUS
INTRAVENOUS | Status: DC | PRN
  Administered 2019-06-27: 160 mg via INTRAVENOUS

## 2019-06-27 MED ORDER — LIDOCAINE 2% (20 MG/ML) 5 ML SYRINGE
INTRAMUSCULAR | Status: DC | PRN
  Administered 2019-06-27: 40 mg via INTRAVENOUS

## 2019-06-27 MED ORDER — ROCURONIUM BROMIDE 10 MG/ML (PF) SYRINGE
PREFILLED_SYRINGE | INTRAVENOUS | Status: DC | PRN
  Administered 2019-06-27: 60 mg via INTRAVENOUS

## 2019-06-27 SURGICAL SUPPLY — 51 items
BAG DECANTER FOR FLEXI CONT (MISCELLANEOUS) ×2 IMPLANT
BENZOIN TINCTURE PRP APPL 2/3 (GAUZE/BANDAGES/DRESSINGS) ×2 IMPLANT
BLADE CLIPPER SURG (BLADE) IMPLANT
BUR MATCHSTICK NEURO 3.0 LAGG (BURR) IMPLANT
BUR PRECISION FLUTE 6.0 (BURR) IMPLANT
CANISTER SUCT 3000ML PPV (MISCELLANEOUS) IMPLANT
CARTRIDGE OIL MAESTRO DRILL (MISCELLANEOUS) ×1 IMPLANT
COVER WAND RF STERILE (DRAPES) ×2 IMPLANT
DECANTER SPIKE VIAL GLASS SM (MISCELLANEOUS) ×2 IMPLANT
DERMABOND ADVANCED (GAUZE/BANDAGES/DRESSINGS) ×1
DERMABOND ADVANCED .7 DNX12 (GAUZE/BANDAGES/DRESSINGS) ×1 IMPLANT
DIFFUSER DRILL AIR PNEUMATIC (MISCELLANEOUS) ×2 IMPLANT
DRAPE HALF SHEET 40X57 (DRAPES) IMPLANT
DRAPE LAPAROTOMY 100X72X124 (DRAPES) ×2 IMPLANT
DRAPE POUCH INSTRU U-SHP 10X18 (DRAPES) ×2 IMPLANT
DRAPE SURG 17X23 STRL (DRAPES) ×2 IMPLANT
DRSG OPSITE POSTOP 4X6 (GAUZE/BANDAGES/DRESSINGS) ×2 IMPLANT
ELECT REM PT RETURN 9FT ADLT (ELECTROSURGICAL) ×2
ELECTRODE REM PT RTRN 9FT ADLT (ELECTROSURGICAL) ×1 IMPLANT
EVACUATOR 1/8 PVC DRAIN (DRAIN) ×2 IMPLANT
GAUZE 4X4 16PLY RFD (DISPOSABLE) IMPLANT
GAUZE SPONGE 4X4 12PLY STRL (GAUZE/BANDAGES/DRESSINGS) ×2 IMPLANT
GLOVE BIO SURGEON STRL SZ7 (GLOVE) IMPLANT
GLOVE BIO SURGEON STRL SZ8 (GLOVE) ×2 IMPLANT
GLOVE BIOGEL PI IND STRL 7.0 (GLOVE) IMPLANT
GLOVE BIOGEL PI INDICATOR 7.0 (GLOVE)
GLOVE EXAM NITRILE XL STR (GLOVE) IMPLANT
GLOVE INDICATOR 8.5 STRL (GLOVE) ×2 IMPLANT
GOWN STRL REUS W/ TWL LRG LVL3 (GOWN DISPOSABLE) ×2 IMPLANT
GOWN STRL REUS W/ TWL XL LVL3 (GOWN DISPOSABLE) ×2 IMPLANT
GOWN STRL REUS W/TWL 2XL LVL3 (GOWN DISPOSABLE) IMPLANT
GOWN STRL REUS W/TWL LRG LVL3 (GOWN DISPOSABLE) ×2
GOWN STRL REUS W/TWL XL LVL3 (GOWN DISPOSABLE) ×2
KIT BASIN OR (CUSTOM PROCEDURE TRAY) ×2 IMPLANT
KIT TURNOVER KIT B (KITS) ×2 IMPLANT
NEEDLE HYPO 21X1.5 SAFETY (NEEDLE) ×2 IMPLANT
NEEDLE HYPO 22GX1.5 SAFETY (NEEDLE) ×2 IMPLANT
NEEDLE SPNL 22GX3.5 QUINCKE BK (NEEDLE) ×2 IMPLANT
NS IRRIG 1000ML POUR BTL (IV SOLUTION) ×2 IMPLANT
OIL CARTRIDGE MAESTRO DRILL (MISCELLANEOUS) ×2
PACK LAMINECTOMY NEURO (CUSTOM PROCEDURE TRAY) ×2 IMPLANT
SPONGE SURGIFOAM ABS GEL SZ50 (HEMOSTASIS) ×2 IMPLANT
STRIP CLOSURE SKIN 1/2X4 (GAUZE/BANDAGES/DRESSINGS) ×2 IMPLANT
SUT VIC AB 0 CT1 18XCR BRD8 (SUTURE) ×1 IMPLANT
SUT VIC AB 0 CT1 8-18 (SUTURE) ×1
SUT VIC AB 2-0 CT1 18 (SUTURE) ×2 IMPLANT
SUT VICRYL 4-0 PS2 18IN ABS (SUTURE) ×2 IMPLANT
SYRINGE 20CC LL (MISCELLANEOUS) ×2 IMPLANT
TOWEL GREEN STERILE (TOWEL DISPOSABLE) ×2 IMPLANT
TOWEL GREEN STERILE FF (TOWEL DISPOSABLE) ×2 IMPLANT
WATER STERILE IRR 1000ML POUR (IV SOLUTION) ×2 IMPLANT

## 2019-06-27 NOTE — Anesthesia Preprocedure Evaluation (Addendum)
Anesthesia Evaluation  Patient identified by MRN, date of birth, ID band Patient awake    Reviewed: Allergy & Precautions, NPO status , Patient's Chart, lab work & pertinent test results  History of Anesthesia Complications Negative for: history of anesthetic complications  Airway Mallampati: II  TM Distance: >3 FB Neck ROM: Full    Dental  (+) Edentulous Upper, Edentulous Lower   Pulmonary former smoker,  06/23/2019 SARS coronavirus NEG   breath sounds clear to auscultation       Cardiovascular negative cardio ROS   Rhythm:Regular Rate:Normal     Neuro/Psych Chronic back pain negative psych ROS   GI/Hepatic Neg liver ROS, GERD  Medicated and Controlled,S/p [employer]   Endo/Other  obese  Renal/GU negative Renal ROS     Musculoskeletal   Abdominal (+) + obese,   Peds  Hematology negative hematology ROS (+)   Anesthesia Other Findings   Reproductive/Obstetrics                            Anesthesia Physical Anesthesia Plan  ASA: II  Anesthesia Plan: General   Post-op Pain Management:    Induction: Intravenous  PONV Risk Score and Plan: 3 and Scopolamine patch - Pre-op, Dexamethasone and Ondansetron  Airway Management Planned: Oral ETT  Additional Equipment:   Intra-op Plan:   Post-operative Plan: Extubation in OR  Informed Consent: I have reviewed the patients History and Physical, chart, labs and discussed the procedure including the risks, benefits and alternatives for the proposed anesthesia with the patient or authorized representative who has indicated his/her understanding and acceptance.     Dental advisory given  Plan Discussed with: CRNA and Surgeon  Anesthesia Plan Comments:        Anesthesia Quick Evaluation

## 2019-06-27 NOTE — Transfer of Care (Signed)
Immediate Anesthesia Transfer of Care Note  Patient: Adrian Byrd  Procedure(s) Performed: Removal of posterior lumbar instrumentation (N/A Back)  Patient Location: PACU  Anesthesia Type:General  Level of Consciousness: awake, alert  and oriented  Airway & Oxygen Therapy: Patient Spontanous Breathing and Patient connected to face mask oxygen  Post-op Assessment: Report given to RN and Post -op Vital signs reviewed and stable  Post vital signs: Reviewed and stable  Last Vitals:  Vitals Value Taken Time  BP 129/54 06/27/19 1152  Temp    Pulse 74 06/27/19 1152  Resp 10 06/27/19 1151  SpO2 95 % 06/27/19 1152  Vitals shown include unvalidated device data.  Last Pain:  Vitals:   06/27/19 0834  TempSrc:   PainSc: 3       Patients Stated Pain Goal: 3 (32/95/18 8416)  Complications: No apparent anesthesia complications and Patient re-intubated

## 2019-06-27 NOTE — Anesthesia Procedure Notes (Signed)
Procedure Name: Intubation Date/Time: 06/27/2019 10:39 AM Performed by: Marsa Aris, CRNA Pre-anesthesia Checklist: Patient identified, Emergency Drugs available, Suction available and Patient being monitored Patient Re-evaluated:Patient Re-evaluated prior to induction Oxygen Delivery Method: Circle System Utilized Preoxygenation: Pre-oxygenation with 100% oxygen Induction Type: IV induction Ventilation: Mask ventilation without difficulty Laryngoscope Size: Miller and 2 Grade View: Grade I Tube type: Oral Number of attempts: 1 Airway Equipment and Method: Stylet and Bite block Placement Confirmation: ETT inserted through vocal cords under direct vision,  positive ETCO2 and breath sounds checked- equal and bilateral Secured at: 22 cm Tube secured with: Tape Dental Injury: Teeth and Oropharynx as per pre-operative assessment

## 2019-06-27 NOTE — Op Note (Signed)
Preoperative diagnosis: Painful hardware  Postoperative diagnosis: Same  Procedure: Reexploration of lumbar fusion for removal of painful hardware and loose S1 screw  Surgeon: Dominica Severin Darlin Stenseth  Anesthesia: General  EBL: Minimal  HPI: Patient is a very pleasant 68 year old gentleman previously undergone L3-S1 interbody fusion with unilateral perk screws placed from L3-S1 on the left.  Patient's had persistent low back pain centered around the loose S1 screw fusion appear to be solid on lumbar spine CT so I recommended removal of hardware I extensively went over the risks and benefits of that procedure with him as well as perioperative course expectations of outcome and alternatives of surgery and he understood and agreed to proceed forward.  Operative procedure: Patient brought into the OR was due to general anesthesia positioned prone Wilson frame his back was prepped and draped in routine sterile fashion.  Utilizing his old incision extending a little bit cephalad caudally for open exposure of his construct on the left identified dissected through a muscle splitting technique down to the hardware exposed all the Solera hardware and removed the knots rods and all 4 screws.  I then packed the screw holes with bone wax copiously irrigated the wound prior to the removal of the L5 screws I did test the fusion did appear to be solid although the 4 in the 5 screws appeared to have excellent purchase the 3 screw and the one screw did appear to be loose.  Wound scopes irrigated meticulous hemostasis was maintained a medium Hemovac drain was placed and the wound was closed in layers with interrupted Vicryl in a running 4 subcuticular Dermabond benzoin Steri-Strips and a sterile dressing was applied patient recovery in stable condition.  At the end the case on needle count sponge counts were correct.

## 2019-06-27 NOTE — Anesthesia Postprocedure Evaluation (Signed)
Anesthesia Post Note  Patient: Adrian Byrd  Procedure(s) Performed: Removal of posterior lumbar instrumentation (N/A Back)     Patient location during evaluation: PACU Anesthesia Type: General Level of consciousness: awake and alert, oriented and patient cooperative Pain management: pain level controlled Vital Signs Assessment: post-procedure vital signs reviewed and stable Respiratory status: spontaneous breathing, nonlabored ventilation and respiratory function stable Cardiovascular status: blood pressure returned to baseline and stable Postop Assessment: no apparent nausea or vomiting Anesthetic complications: no    Last Vitals:  Vitals:   06/27/19 1340 06/27/19 1547  BP: (!) 151/73 (!) 117/58  Pulse: 64 75  Resp: 18 19  Temp:    SpO2: 95% 94%    Last Pain:  Vitals:   06/27/19 1345  TempSrc:   PainSc: 0-No pain                 Shariff Lasky,E. Dearies Meikle

## 2019-06-27 NOTE — Discharge Summary (Signed)
  Physician Discharge Summary  Patient ID: Adrian Byrd MRN: [id number] DOB/AGE: 15-Aug-1951 68 y.o. Estimated body mass index is 30.79 kg/m as calculated from the following:   Height as of this encounter: 6' (1.829 m).   Weight as of this encounter: 103 kg.   Admit date: 06/27/2019 Discharge date: 06/27/2019  Admission Diagnoses: Painful hardware  Discharge Diagnoses: Same Active Problems:   Painful orthopaedic hardware [facility])   Discharged Condition: good  Hospital Course: Patient admitted to hospital underwent exploration fusion move of hardware postoperative was doing very well ambulating voiding tolerating her diet and stable for discharge home.  Consults: Significant Diagnostic Studies: Treatments: Removal of hardware Discharge Exam: Blood pressure (!) 117/58, pulse 75, temperature (!) 97 F (36.1 C), resp. rate 19, height 6' (1.829 m), weight 103 kg, SpO2 94 %. Strength 5-5 wound clean dry and intact  Disposition: Home   Allergies as of 06/27/2019   No Known Allergies     Medication List    TAKE these medications   acetaminophen 500 MG tablet Commonly known as: TYLENOL Take 1,000 mg by mouth every 6 (six) hours as needed for moderate pain or headache.   atorvastatin 40 MG tablet Commonly known as: LIPITOR Take 40 mg by mouth at bedtime.   cyclobenzaprine 10 MG tablet Commonly known as: FLEXERIL Take 10 mg by mouth 3 (three) times daily.   gabapentin 600 MG tablet Commonly known as: NEURONTIN Take 600 mg by mouth 3 (three) times daily.   omeprazole 40 MG capsule Commonly known as: PRILOSEC Take 40 mg by mouth 2 (two) times a day.   trolamine salicylate 10 % cream Commonly known as: ASPERCREME Apply 1 application topically as needed for muscle pain.   VITAMIN C PO Take 1 tablet by mouth daily.        Signed: Ansley Stanwood P 06/27/2019, 5:20 PM

## 2019-06-27 NOTE — Plan of Care (Signed)
Pt doing well. Pt given D/C instructions with verbal understanding. Pt's incision is clean and dry with no sign of infection. Pt's IV and Hemovac were removed prior to D/C. Pt D/C'd home via wheelchair @ 1830 per MD order. Pt is stable @ D/C and has no other needs at this time. Holli Humbles, RN

## 2019-06-27 NOTE — H&P (Signed)
Adrian Byrd is an 68 y.o. male.   Chief Complaint: Back pain HPI: 68 year old gentleman history of L3-S1 fusion with unilateral posterior hardware with loosening of his left S1 screw.  Patient is a progressive worsening back pain with activity work-up has revealed what appears to be solid fusions from 3-1 but our loose left S1 screw that is felt to be the source of his pain.  So I recommended hardware removal.  I extensively gone over the risks and benefits of the operation with him as well as perioperative course expectations of outcome and alternatives of surgery and he understands and agrees to proceed forward.  Past Medical History:  Diagnosis Date  . GERD (gastroesophageal reflux disease)   . High cholesterol   . History of kidney stones   . Neuromuscular disorder ([place])   . Pneumonia     Past Surgical History:  Procedure Laterality Date  . BACK SURGERY    . CYSTOSCOPY/RETROGRADE/URETEROSCOPY Bilateral 08/22/2018   Procedure: CYSTOSCOPY/RETROGRADE/BILATERAL URETEROSCOPY AND LASER/RIGHT URETERAL STENT PLACEMENT;  Surgeon: Lucas Mallow, MD;  Location: [facility];  Service: Urology;  Laterality: Bilateral;  . TONSILLECTOMY      History reviewed. No pertinent family history. Social History:  reports that he has quit smoking. He has quit using smokeless tobacco. He reports current alcohol use. No history on file for drug.  Allergies: No Known Allergies  Medications Prior to Admission  Medication Sig Dispense Refill  . Ascorbic Acid (VITAMIN C PO) Take 1 tablet by mouth daily.    Marland Kitchen atorvastatin (LIPITOR) 40 MG tablet Take 40 mg by mouth at bedtime.     . cyclobenzaprine (FLEXERIL) 10 MG tablet Take 10 mg by mouth 3 (three) times daily.    Marland Kitchen gabapentin (NEURONTIN) 600 MG tablet Take 600 mg by mouth 3 (three) times daily.     Marland Kitchen omeprazole (PRILOSEC) 40 MG capsule Take 40 mg by mouth 2 (two) times a day.    . trolamine salicylate (ASPERCREME) 10 % cream Apply 1 application topically  as needed for muscle pain.    Marland Kitchen acetaminophen (TYLENOL) 500 MG tablet Take 1,000 mg by mouth every 6 (six) hours as needed for moderate pain or headache.      No results found for this or any previous visit (from the past 48 hour(s)). No results found.  Review of Systems  Musculoskeletal: Positive for back pain.    Blood pressure (!) 122/52, pulse 63, temperature (!) 97.4 F (36.3 C), temperature source Oral, resp. rate 20, height 6' (1.829 m), weight 103 kg, SpO2 97 %. Physical Exam  Constitutional: He is oriented to person, place, and time. He appears well-developed.  HENT:  Head: Normocephalic.  Eyes: Pupils are equal, round, and reactive to light.  Neck: Normal range of motion.  Respiratory: Effort normal.  GI: Soft.  Musculoskeletal: Normal range of motion.  Neurological: He is alert and oriented to person, place, and time. He has normal strength. GCS eye subscore is 4. GCS verbal subscore is 5. GCS motor subscore is 6.  Strength 5 out of 5 strength 5 out of 5 iliopsoas, quads, hamstrings, gastroc, into tibialis, and EHL.  Skin: Skin is warm and dry.     Assessment/Plan 68 year old gentleman presents for hardware removal  [name] P, MD 06/27/2019, 10:22 AM

## 2019-06-28 ENCOUNTER — Encounter (HOSPITAL_COMMUNITY): Payer: Self-pay | Admitting: Neurosurgery

## 2021-02-19 ENCOUNTER — Other Ambulatory Visit: Payer: Self-pay | Admitting: Neurosurgery

## 2021-02-19 DIAGNOSIS — M48062 Spinal stenosis, lumbar region with neurogenic claudication: Secondary | ICD-10-CM

## 2021-02-21 ENCOUNTER — Ambulatory Visit
Admission: RE | Admit: 2021-02-21 | Discharge: 2021-02-21 | Disposition: A | Discharge status: Home / Self Care | Payer: Medicare Other | Source: Ambulatory Visit | Attending: Neurosurgery | Admitting: Neurosurgery

## 2021-02-21 DIAGNOSIS — M48062 Spinal stenosis, lumbar region with neurogenic claudication: Secondary | ICD-10-CM

## 2021-02-28 ENCOUNTER — Other Ambulatory Visit: Payer: Medicare Other

## 2021-04-03 ENCOUNTER — Other Ambulatory Visit: Payer: Self-pay | Admitting: Neurosurgery

## 2021-04-06 NOTE — Pre-Procedure Instructions (Signed)
Surgical Instructions    Your procedure is scheduled on Wednesday April 27th.   Report to [facility] Main Entrance "A" at 0900 A.M., then check in with the Admitting office.  Call this number if you have problems the morning of surgery:  [phone]   If you have any questions prior to your surgery date call [phone]: Open Monday-Friday 8am-4pm    Remember:  Do not eat or drink after midnight the night before your surgery      Take these medicines the morning of surgery with A SIP OF WATER   omeprazole (PRILOSEC)   Take theses medicines if needed  acetaminophen (TYLENOL)   As of today, STOP taking any Aspirin (unless otherwise instructed by your surgeon) Aleve, Naproxen, Ibuprofen, Motrin, Advil, Goody's, BC's, all herbal medications, fish oil, and all vitamins.                     Do not wear jewelry            Do not wear lotions, powders, colognes, or deodorant.            Do not shave 48 hours prior to surgery.  Men may shave face and neck.            Do not bring valuables to the hospital.            [facility] is not responsible for any belongings or valuables.  Do NOT Smoke (Tobacco/Vaping) or drink Alcohol 24 hours prior to your procedure If you use a CPAP at night, you may bring all equipment for your overnight stay.   Contacts, glasses, dentures or partials may not be worn into surgery, please bring cases for these belongings   For patients admitted to the hospital, discharge time will be determined by your treatment team.   Patients discharged the day of surgery will not be allowed to drive home, and someone needs to stay with them for 24 hours.    Special instructions:   [place]- Preparing For Surgery  Before surgery, you can play an important role. Because skin is not sterile, your skin needs to be as free of germs as possible. You can reduce the number of germs on your skin by washing with CHG (chlorahexidine gluconate) Soap before surgery.  CHG is  an antiseptic cleaner which kills germs and bonds with the skin to continue killing germs even after washing.    Oral Hygiene is also important to reduce your risk of infection.  Remember - BRUSH YOUR TEETH THE MORNING OF SURGERY WITH YOUR REGULAR TOOTHPASTE  Please do not use if you have an allergy to CHG or antibacterial soaps. If your skin becomes reddened/irritated stop using the CHG.  Do not shave (including legs and underarms) for at least 48 hours prior to first CHG shower. It is OK to shave your face.  Please follow these instructions carefully.   1. Shower the NIGHT BEFORE SURGERY and the MORNING OF SURGERY  2. If you chose to wash your hair, wash your hair first as usual with your normal shampoo.  3. After you shampoo, rinse your hair and body thoroughly to remove the shampoo.   4. Use CHG Soap as you would any other liquid soap. You can apply CHG directly to the skin and wash gently with a scrungie or a clean washcloth.   5. Apply the CHG Soap to your body ONLY FROM THE NECK DOWN.  Do not use on open  wounds or open sores. Avoid contact with your eyes, ears, mouth and genitals (private parts). Wash Face and genitals (private parts)  with your normal soap.   6. Wash thoroughly, paying special attention to the area where your surgery will be performed.  7. Thoroughly rinse your body with warm water from the neck down.  8. DO NOT shower/wash with your normal soap after using and rinsing off the CHG Soap.  9. Pat yourself dry with a CLEAN TOWEL.  10. Wear CLEAN PAJAMAS to bed the night before surgery  11. Place CLEAN SHEETS on your bed the night before your surgery  12. DO NOT SLEEP WITH PETS.   Day of Surgery: Take a shower with CHG soap. Wear Clean/Comfortable clothing the morning of surgery Do not apply any deodorants/lotions.   Remember to brush your teeth WITH YOUR REGULAR TOOTHPASTE.   Please read over the following fact sheets that you were given.

## 2021-04-07 ENCOUNTER — Encounter (HOSPITAL_COMMUNITY)
Admission: RE | Admit: 2021-04-07 | Discharge: 2021-04-07 | Disposition: A | Discharge status: Home / Self Care | Payer: Medicare Other | Source: Ambulatory Visit | Attending: Neurosurgery | Admitting: Neurosurgery

## 2021-04-07 ENCOUNTER — Other Ambulatory Visit: Payer: Self-pay

## 2021-04-07 ENCOUNTER — Encounter (HOSPITAL_COMMUNITY): Payer: Self-pay

## 2021-04-07 DIAGNOSIS — Z01818 Encounter for other preprocedural examination: Secondary | ICD-10-CM | POA: Diagnosis not present

## 2021-04-07 DIAGNOSIS — Z20822 Contact with and (suspected) exposure to covid-19: Secondary | ICD-10-CM | POA: Insufficient documentation

## 2021-04-07 LAB — CBC
HCT: 44.7 % (ref 39.0–52.0)
Hemoglobin: 14.2 g/dL (ref 13.0–17.0)
MCH: 30.4 pg (ref 26.0–34.0)
MCHC: 31.8 g/dL (ref 30.0–36.0)
MCV: 95.7 fL (ref 80.0–100.0)
Platelets: 240 10*3/uL (ref 150–400)
RBC: 4.67 MIL/uL (ref 4.22–5.81)
RDW: 12.9 % (ref 11.5–15.5)
WBC: 8.3 10*3/uL (ref 4.0–10.5)
nRBC: 0 % (ref 0.0–0.2)

## 2021-04-07 LAB — SURGICAL PCR SCREEN
MRSA, PCR: NEGATIVE
Staphylococcus aureus: NEGATIVE

## 2021-04-07 LAB — TYPE AND SCREEN
ABO/RH(D): B POS
Antibody Screen: NEGATIVE

## 2021-04-07 LAB — BASIC METABOLIC PANEL
Anion gap: 5 (ref 5–15)
BUN: 13 mg/dL (ref 8–23)
CO2: 28 mmol/L (ref 22–32)
Calcium: 9 mg/dL (ref 8.9–10.3)
Chloride: 107 mmol/L (ref 98–111)
Creatinine, Ser: 1.02 mg/dL (ref 0.61–1.24)
GFR, Estimated: >60 mL/min (ref >60)
Glucose, Bld: 114 mg/dL — ABNORMAL HIGH (ref 70–99)
Potassium: 3.9 mmol/L (ref 3.5–5.1)
Sodium: 140 mmol/L (ref 135–145)

## 2021-04-07 LAB — SARS CORONAVIRUS 2 (TAT 6-24 HRS): SARS Coronavirus 2: NEGATIVE

## 2021-04-07 NOTE — Progress Notes (Signed)
PCP - Betsey Holiday PA-C Cardiologist - denies  Chest x-ray - N/A EKG - N/A Stress Test - 5+ years ago. Pt stated it was normal and didn't require follow-up   ECHO - denies Cardiac Cath - denies  Sleep Study - denies CPAP - denies  Blood Thinner Instructions: n/a Aspirin Instructions: n/a  COVID TEST- 04/07/21; pending. Pt aware to quarantine after testing until DOS.  Anesthesia review: No  Patient denies shortness of breath, fever, cough and chest pain at PAT appointment   All instructions explained to the patient, with a verbal understanding of the material. Patient agrees to go over the instructions while at home for a better understanding. Patient also instructed to self quarantine after being tested for COVID-19. The opportunity to ask questions was provided.

## 2021-04-08 ENCOUNTER — Ambulatory Visit (HOSPITAL_COMMUNITY): Payer: Medicare Other

## 2021-04-08 ENCOUNTER — Encounter (HOSPITAL_COMMUNITY): Payer: Self-pay | Admitting: Neurosurgery

## 2021-04-08 ENCOUNTER — Encounter (HOSPITAL_COMMUNITY)
Admission: RE | Disposition: A | Discharge status: Home / Self Care | Payer: Self-pay | Source: Home / Self Care | Attending: Neurosurgery

## 2021-04-08 ENCOUNTER — Ambulatory Visit (HOSPITAL_COMMUNITY): Payer: Medicare Other | Admitting: Certified Registered"

## 2021-04-08 ENCOUNTER — Ambulatory Visit (HOSPITAL_COMMUNITY)
Admission: RE | Admit: 2021-04-08 | Discharge: 2021-04-09 | Disposition: A | Discharge status: Home / Self Care | Payer: Medicare Other | Attending: Neurosurgery | Admitting: Neurosurgery

## 2021-04-08 DIAGNOSIS — Z981 Arthrodesis status: Secondary | ICD-10-CM | POA: Diagnosis not present

## 2021-04-08 DIAGNOSIS — M48061 Spinal stenosis, lumbar region without neurogenic claudication: Secondary | ICD-10-CM | POA: Diagnosis not present

## 2021-04-08 DIAGNOSIS — M5126 Other intervertebral disc displacement, lumbar region: Secondary | ICD-10-CM | POA: Insufficient documentation

## 2021-04-08 DIAGNOSIS — Z87891 Personal history of nicotine dependence: Secondary | ICD-10-CM | POA: Insufficient documentation

## 2021-04-08 DIAGNOSIS — M532X6 Spinal instabilities, lumbar region: Secondary | ICD-10-CM | POA: Insufficient documentation

## 2021-04-08 DIAGNOSIS — Z79899 Other long term (current) drug therapy: Secondary | ICD-10-CM | POA: Diagnosis not present

## 2021-04-08 DIAGNOSIS — Z419 Encounter for procedure for purposes other than remedying health state, unspecified: Secondary | ICD-10-CM

## 2021-04-08 LAB — ABO/RH: ABO/RH(D): B POS

## 2021-04-08 SURGERY — POSTERIOR LUMBAR FUSION 1 LEVEL
Anesthesia: General | Site: Spine Lumbar

## 2021-04-08 MED ORDER — PROPOFOL 10 MG/ML IV BOLUS
INTRAVENOUS | Status: DC | PRN
  Administered 2021-04-08: 150 mg via INTRAVENOUS

## 2021-04-08 MED ORDER — PHENOL 1.4 % MT LIQD: 1.0000 | OROMUCOSAL | Status: DC | PRN

## 2021-04-08 MED ORDER — PANTOPRAZOLE SODIUM 40 MG IV SOLR: 40.0000 mg | Freq: Every day | INTRAVENOUS | Status: DC

## 2021-04-08 MED ORDER — PSYLLIUM 0.52 G PO CAPS: 0.5200 g | ORAL_CAPSULE | Freq: Three times a day (TID) | ORAL | Status: DC

## 2021-04-08 MED ORDER — DEXAMETHASONE SODIUM PHOSPHATE 10 MG/ML IJ SOLN
10.0000 mg | Freq: Once | INTRAMUSCULAR | Status: AC
  Administered 2021-04-08: 10 mg via INTRAVENOUS

## 2021-04-08 MED ORDER — HYDROMORPHONE HCL 1 MG/ML IJ SOLN
INTRAMUSCULAR | Status: AC
  Filled 2021-04-08: qty 1

## 2021-04-08 MED ORDER — HYDROMORPHONE HCL 1 MG/ML IJ SOLN: 0.5000 mg | INTRAMUSCULAR | Status: DC | PRN

## 2021-04-08 MED ORDER — ALBUMIN HUMAN 5 % IV SOLN: INTRAVENOUS | Status: DC | PRN

## 2021-04-08 MED ORDER — ONDANSETRON HCL 4 MG/2ML IJ SOLN
INTRAMUSCULAR | Status: DC | PRN
  Administered 2021-04-08: 4 mg via INTRAVENOUS

## 2021-04-08 MED ORDER — CHLORHEXIDINE GLUCONATE CLOTH 2 % EX PADS: 6.0000 | MEDICATED_PAD | Freq: Once | CUTANEOUS | Status: DC

## 2021-04-08 MED ORDER — BUPIVACAINE LIPOSOME 1.3 % IJ SUSP
INTRAMUSCULAR | Status: AC
  Filled 2021-04-08: qty 20

## 2021-04-08 MED ORDER — CYCLOBENZAPRINE HCL 10 MG PO TABS
10.0000 mg | ORAL_TABLET | Freq: Three times a day (TID) | ORAL | Status: DC | PRN
  Administered 2021-04-08: 10 mg via ORAL
  Filled 2021-04-08: qty 1

## 2021-04-08 MED ORDER — ONDANSETRON HCL 4 MG/2ML IJ SOLN: 4.0000 mg | Freq: Four times a day (QID) | INTRAMUSCULAR | Status: DC | PRN

## 2021-04-08 MED ORDER — LIDOCAINE-EPINEPHRINE 1 %-1:100000 IJ SOLN
INTRAMUSCULAR | Status: AC
  Filled 2021-04-08: qty 1

## 2021-04-08 MED ORDER — ORAL CARE MOUTH RINSE: 15.0000 mL | Freq: Once | OROMUCOSAL | Status: AC

## 2021-04-08 MED ORDER — SODIUM CHLORIDE 0.9 % IV SOLN
250.0000 mL | INTRAVENOUS | Status: DC
  Administered 2021-04-08: 250 mL via INTRAVENOUS

## 2021-04-08 MED ORDER — FENTANYL CITRATE (PF) 250 MCG/5ML IJ SOLN
INTRAMUSCULAR | Status: DC | PRN
  Administered 2021-04-08: 100 ug via INTRAVENOUS

## 2021-04-08 MED ORDER — FENTANYL CITRATE (PF) 250 MCG/5ML IJ SOLN
INTRAMUSCULAR | Status: AC
  Filled 2021-04-08: qty 5

## 2021-04-08 MED ORDER — PSYLLIUM 95 % PO PACK
1.0000 | PACK | Freq: Every day | ORAL | Status: DC
  Filled 2021-04-08: qty 1

## 2021-04-08 MED ORDER — BUPIVACAINE LIPOSOME 1.3 % IJ SUSP
INTRAMUSCULAR | Status: DC | PRN
  Administered 2021-04-08: 20 mL

## 2021-04-08 MED ORDER — ACETAMINOPHEN 10 MG/ML IV SOLN: 1000.0000 mg | Freq: Once | INTRAVENOUS | Status: DC | PRN

## 2021-04-08 MED ORDER — PHENYLEPHRINE HCL-NACL 10-0.9 MG/250ML-% IV SOLN
INTRAVENOUS | Status: DC | PRN
  Administered 2021-04-08: 40 ug/min via INTRAVENOUS

## 2021-04-08 MED ORDER — ROCURONIUM BROMIDE 10 MG/ML (PF) SYRINGE
PREFILLED_SYRINGE | INTRAVENOUS | Status: AC
  Filled 2021-04-08: qty 10

## 2021-04-08 MED ORDER — ACETAMINOPHEN 500 MG PO TABS: 1000.0000 mg | ORAL_TABLET | Freq: Four times a day (QID) | ORAL | Status: DC | PRN

## 2021-04-08 MED ORDER — ACETAMINOPHEN 650 MG RE SUPP: 650.0000 mg | RECTAL | Status: DC | PRN

## 2021-04-08 MED ORDER — ACETAMINOPHEN 160 MG/5ML PO SOLN: 325.0000 mg | Freq: Once | ORAL | Status: DC | PRN

## 2021-04-08 MED ORDER — SODIUM CHLORIDE 0.9% FLUSH
3.0000 mL | Freq: Two times a day (BID) | INTRAVENOUS | Status: DC
  Administered 2021-04-08: 3 mL via INTRAVENOUS

## 2021-04-08 MED ORDER — PROMETHAZINE HCL 25 MG/ML IJ SOLN: 6.2500 mg | INTRAMUSCULAR | Status: DC | PRN

## 2021-04-08 MED ORDER — LIDOCAINE-EPINEPHRINE 1 %-1:100000 IJ SOLN
INTRAMUSCULAR | Status: DC | PRN
  Administered 2021-04-08: 10 mL

## 2021-04-08 MED ORDER — THROMBIN 20000 UNITS EX SOLR: CUTANEOUS | Status: DC | PRN

## 2021-04-08 MED ORDER — LIDOCAINE 2% (20 MG/ML) 5 ML SYRINGE
INTRAMUSCULAR | Status: DC | PRN
  Administered 2021-04-08: 40 mg via INTRAVENOUS

## 2021-04-08 MED ORDER — PHENYLEPHRINE 40 MCG/ML (10ML) SYRINGE FOR IV PUSH (FOR BLOOD PRESSURE SUPPORT)
PREFILLED_SYRINGE | INTRAVENOUS | Status: DC | PRN
  Administered 2021-04-08 (×2): 80 ug via INTRAVENOUS
  Administered 2021-04-08: 120 ug via INTRAVENOUS

## 2021-04-08 MED ORDER — SODIUM CHLORIDE 0.9% FLUSH: 3.0000 mL | INTRAVENOUS | Status: DC | PRN

## 2021-04-08 MED ORDER — LIDOCAINE 2% (20 MG/ML) 5 ML SYRINGE
INTRAMUSCULAR | Status: AC
  Filled 2021-04-08: qty 5

## 2021-04-08 MED ORDER — MENTHOL 3 MG MT LOZG: 1.0000 | LOZENGE | OROMUCOSAL | Status: DC | PRN

## 2021-04-08 MED ORDER — HYDROMORPHONE HCL 1 MG/ML IJ SOLN
0.2500 mg | INTRAMUSCULAR | Status: DC | PRN
  Administered 2021-04-08 (×2): 0.5 mg via INTRAVENOUS

## 2021-04-08 MED ORDER — ACETAMINOPHEN 325 MG PO TABS: 325.0000 mg | ORAL_TABLET | Freq: Once | ORAL | Status: DC | PRN

## 2021-04-08 MED ORDER — PANTOPRAZOLE SODIUM 40 MG PO TBEC: 40.0000 mg | DELAYED_RELEASE_TABLET | Freq: Every day | ORAL | Status: DC

## 2021-04-08 MED ORDER — PROPOFOL 10 MG/ML IV BOLUS
INTRAVENOUS | Status: AC
  Filled 2021-04-08: qty 20

## 2021-04-08 MED ORDER — MEPERIDINE HCL 25 MG/ML IJ SOLN: 6.2500 mg | INTRAMUSCULAR | Status: DC | PRN

## 2021-04-08 MED ORDER — ATORVASTATIN CALCIUM 40 MG PO TABS
40.0000 mg | ORAL_TABLET | Freq: Every day | ORAL | Status: DC
  Administered 2021-04-08: 40 mg via ORAL
  Filled 2021-04-08: qty 1

## 2021-04-08 MED ORDER — AMISULPRIDE (ANTIEMETIC) 5 MG/2ML IV SOLN: 10.0000 mg | Freq: Once | INTRAVENOUS | Status: DC | PRN

## 2021-04-08 MED ORDER — CEFAZOLIN SODIUM-DEXTROSE 2-4 GM/100ML-% IV SOLN
2.0000 g | Freq: Three times a day (TID) | INTRAVENOUS | Status: AC
  Administered 2021-04-08 – 2021-04-09 (×2): 2 g via INTRAVENOUS
  Filled 2021-04-08 (×2): qty 100

## 2021-04-08 MED ORDER — HYDROCODONE-ACETAMINOPHEN 5-325 MG PO TABS
1.0000 | ORAL_TABLET | ORAL | Status: DC | PRN
  Administered 2021-04-08 – 2021-04-09 (×4): 2 via ORAL
  Filled 2021-04-08 (×4): qty 2

## 2021-04-08 MED ORDER — 0.9 % SODIUM CHLORIDE (POUR BTL) OPTIME
TOPICAL | Status: DC | PRN
  Administered 2021-04-08: 1000 mL

## 2021-04-08 MED ORDER — THROMBIN 5000 UNITS EX SOLR
CUTANEOUS | Status: AC
  Filled 2021-04-08: qty 5000

## 2021-04-08 MED ORDER — LACTATED RINGERS IV SOLN: INTRAVENOUS | Status: DC

## 2021-04-08 MED ORDER — DEXAMETHASONE SODIUM PHOSPHATE 10 MG/ML IJ SOLN
INTRAMUSCULAR | Status: AC
  Filled 2021-04-08: qty 1

## 2021-04-08 MED ORDER — THROMBIN 20000 UNITS EX SOLR
CUTANEOUS | Status: AC
  Filled 2021-04-08: qty 20000

## 2021-04-08 MED ORDER — OXYCODONE HCL 5 MG PO TABS
10.0000 mg | ORAL_TABLET | ORAL | Status: DC | PRN
  Filled 2021-04-08: qty 2

## 2021-04-08 MED ORDER — ASCORBIC ACID 500 MG PO TABS
500.0000 mg | ORAL_TABLET | Freq: Every day | ORAL | Status: DC
  Filled 2021-04-08: qty 1

## 2021-04-08 MED ORDER — ONDANSETRON HCL 4 MG PO TABS: 4.0000 mg | ORAL_TABLET | Freq: Four times a day (QID) | ORAL | Status: DC | PRN

## 2021-04-08 MED ORDER — ROCURONIUM BROMIDE 10 MG/ML (PF) SYRINGE
PREFILLED_SYRINGE | INTRAVENOUS | Status: DC | PRN
  Administered 2021-04-08: 50 mg via INTRAVENOUS
  Administered 2021-04-08: 40 mg via INTRAVENOUS

## 2021-04-08 MED ORDER — ACETAMINOPHEN 325 MG PO TABS: 650.0000 mg | ORAL_TABLET | ORAL | Status: DC | PRN

## 2021-04-08 MED ORDER — CEFAZOLIN SODIUM-DEXTROSE 2-4 GM/100ML-% IV SOLN
2.0000 g | INTRAVENOUS | Status: AC
  Administered 2021-04-08: 2 g via INTRAVENOUS

## 2021-04-08 MED ORDER — ONDANSETRON HCL 4 MG/2ML IJ SOLN
INTRAMUSCULAR | Status: AC
  Filled 2021-04-08: qty 2

## 2021-04-08 MED ORDER — CEFAZOLIN SODIUM-DEXTROSE 2-4 GM/100ML-% IV SOLN
INTRAVENOUS | Status: AC
  Filled 2021-04-08: qty 100

## 2021-04-08 MED ORDER — SUGAMMADEX SODIUM 200 MG/2ML IV SOLN
INTRAVENOUS | Status: DC | PRN
  Administered 2021-04-08: 200 mg via INTRAVENOUS

## 2021-04-08 MED ORDER — ALUM & MAG HYDROXIDE-SIMETH 200-200-20 MG/5ML PO SUSP: 30.0000 mL | Freq: Four times a day (QID) | ORAL | Status: DC | PRN

## 2021-04-08 MED ORDER — CHLORHEXIDINE GLUCONATE 0.12 % MT SOLN
15.0000 mL | Freq: Once | OROMUCOSAL | Status: AC
  Administered 2021-04-08: 15 mL via OROMUCOSAL
  Filled 2021-04-08: qty 15

## 2021-04-08 SURGICAL SUPPLY — 67 items
BASKET BONE COLLECTION (BASKET) ×2 IMPLANT
BENZOIN TINCTURE PRP APPL 2/3 (GAUZE/BANDAGES/DRESSINGS) ×2 IMPLANT
BLADE CLIPPER SURG (BLADE) IMPLANT
BLADE SURG 11 STRL SS (BLADE) ×2 IMPLANT
BONE VIVIGEN FORMABLE 5.4CC (Bone Implant) ×2 IMPLANT
BUR CUTTER 7.0 ROUND (BURR) ×2 IMPLANT
BUR MATCHSTICK NEURO 3.0 LAGG (BURR) ×2 IMPLANT
CANISTER SUCT 3000ML PPV (MISCELLANEOUS) ×2 IMPLANT
CAP LOCKING THREADED (Cap) ×8 IMPLANT
CARTRIDGE OIL MAESTRO DRILL (MISCELLANEOUS) ×1 IMPLANT
CNTNR URN SCR LID CUP LEK RST (MISCELLANEOUS) ×1 IMPLANT
CONT SPEC 4OZ STRL OR WHT (MISCELLANEOUS) ×1
COVER BACK TABLE 60X90IN (DRAPES) ×2 IMPLANT
COVER WAND RF STERILE (DRAPES) ×2 IMPLANT
DECANTER SPIKE VIAL GLASS SM (MISCELLANEOUS) ×2 IMPLANT
DERMABOND ADVANCED (GAUZE/BANDAGES/DRESSINGS) ×1
DERMABOND ADVANCED .7 DNX12 (GAUZE/BANDAGES/DRESSINGS) ×1 IMPLANT
DIFFUSER DRILL AIR PNEUMATIC (MISCELLANEOUS) ×2 IMPLANT
DRAPE C-ARM 42X72 X-RAY (DRAPES) ×4 IMPLANT
DRAPE C-ARMOR (DRAPES) IMPLANT
DRAPE HALF SHEET 40X57 (DRAPES) IMPLANT
DRAPE LAPAROTOMY 100X72X124 (DRAPES) ×2 IMPLANT
DRAPE SURG 17X23 STRL (DRAPES) ×2 IMPLANT
DRSG OPSITE 4X5.5 SM (GAUZE/BANDAGES/DRESSINGS) ×2 IMPLANT
DRSG OPSITE POSTOP 4X6 (GAUZE/BANDAGES/DRESSINGS) ×2 IMPLANT
DURAPREP 26ML APPLICATOR (WOUND CARE) ×2 IMPLANT
ELECT REM PT RETURN 9FT ADLT (ELECTROSURGICAL) ×2
ELECTRODE REM PT RTRN 9FT ADLT (ELECTROSURGICAL) ×1 IMPLANT
EVACUATOR 3/16  PVC DRAIN (DRAIN)
EVACUATOR 3/16 PVC DRAIN (DRAIN) IMPLANT
GAUZE 4X4 16PLY RFD (DISPOSABLE) IMPLANT
GAUZE SPONGE 4X4 12PLY STRL (GAUZE/BANDAGES/DRESSINGS) ×2 IMPLANT
GLOVE BIO SURGEON STRL SZ7 (GLOVE) IMPLANT
GLOVE BIO SURGEON STRL SZ8 (GLOVE) ×4 IMPLANT
GLOVE EXAM NITRILE XL STR (GLOVE) IMPLANT
GLOVE INDICATOR 8.5 STRL (GLOVE) ×4 IMPLANT
GLOVE SURG UNDER POLY LF SZ7 (GLOVE) IMPLANT
GOWN STRL REUS W/ TWL LRG LVL3 (GOWN DISPOSABLE) IMPLANT
GOWN STRL REUS W/ TWL XL LVL3 (GOWN DISPOSABLE) ×2 IMPLANT
GOWN STRL REUS W/TWL 2XL LVL3 (GOWN DISPOSABLE) IMPLANT
GOWN STRL REUS W/TWL LRG LVL3 (GOWN DISPOSABLE)
GOWN STRL REUS W/TWL XL LVL3 (GOWN DISPOSABLE) ×2
GRAFT BONE PROTEIOS LRG 5CC (Orthopedic Implant) ×2 IMPLANT
HEMOSTAT POWDER KIT SURGIFOAM (HEMOSTASIS) ×2 IMPLANT
KIT BASIN OR (CUSTOM PROCEDURE TRAY) ×2 IMPLANT
KIT TURNOVER KIT B (KITS) ×2 IMPLANT
MILL MEDIUM DISP (BLADE) ×2 IMPLANT
NEEDLE HYPO 25X1 1.5 SAFETY (NEEDLE) ×2 IMPLANT
NS IRRIG 1000ML POUR BTL (IV SOLUTION) ×2 IMPLANT
OIL CARTRIDGE MAESTRO DRILL (MISCELLANEOUS) ×2
PACK LAMINECTOMY NEURO (CUSTOM PROCEDURE TRAY) ×2 IMPLANT
PAD ARMBOARD 7.5X6 YLW CONV (MISCELLANEOUS) ×6 IMPLANT
ROD 40MM SPINAL (Rod) ×4 IMPLANT
SCREW MOD 6.0-5.0X35MM (Screw) ×8 IMPLANT
SCREW PA THRD CREO TULIP 5.5X4 (Head) ×8 IMPLANT
SPACER SUSTAIN RT 12 8D 9X26 (Spacer) ×4 IMPLANT
SPONGE LAP 4X18 RFD (DISPOSABLE) IMPLANT
SPONGE SURGIFOAM ABS GEL 100 (HEMOSTASIS) ×2 IMPLANT
STRIP CLOSURE SKIN 1/2X4 (GAUZE/BANDAGES/DRESSINGS) ×2 IMPLANT
SUT VIC AB 0 CT1 18XCR BRD8 (SUTURE) ×2 IMPLANT
SUT VIC AB 0 CT1 8-18 (SUTURE) ×2
SUT VIC AB 2-0 CT1 18 (SUTURE) ×2 IMPLANT
SUT VIC AB 4-0 PS2 27 (SUTURE) ×2 IMPLANT
TOWEL GREEN STERILE (TOWEL DISPOSABLE) ×2 IMPLANT
TOWEL GREEN STERILE FF (TOWEL DISPOSABLE) ×2 IMPLANT
TRAY FOLEY MTR SLVR 16FR STAT (SET/KITS/TRAYS/PACK) ×2 IMPLANT
WATER STERILE IRR 1000ML POUR (IV SOLUTION) ×2 IMPLANT

## 2021-04-08 NOTE — Anesthesia Preprocedure Evaluation (Signed)
Anesthesia Evaluation  Patient identified by MRN, date of birth, ID band Patient awake    Reviewed: Allergy & Precautions, NPO status , Patient's Chart, lab work & pertinent test results  Airway Mallampati: I   Neck ROM: Full    Dental  (+) Edentulous Upper, Edentulous Lower   Pulmonary former smoker,    Pulmonary exam normal        Cardiovascular negative cardio ROS   Rhythm:Regular Rate:Normal     Neuro/Psych  Neuromuscular disease    GI/Hepatic Neg liver ROS, GERD  Medicated,  Endo/Other  negative endocrine ROS  Renal/GU negative Renal ROS     Musculoskeletal negative musculoskeletal ROS (+)   Abdominal Normal abdominal exam  (+)   Peds  Hematology negative hematology ROS (+)   Anesthesia Other Findings R UE neuropathy  Reproductive/Obstetrics                             Anesthesia Physical Anesthesia Plan  ASA: II  Anesthesia Plan: General   Post-op Pain Management:    Induction: Intravenous  PONV Risk Score and Plan: 3 and Ondansetron, Dexamethasone and Midazolam  Airway Management Planned: Oral ETT and Video Laryngoscope Planned  Additional Equipment: None  Intra-op Plan:   Post-operative Plan: Extubation in OR  Informed Consent: I have reviewed the patients History and Physical, chart, labs and discussed the procedure including the risks, benefits and alternatives for the proposed anesthesia with the patient or authorized representative who has indicated his/her understanding and acceptance.       Plan Discussed with: CRNA  Anesthesia Plan Comments:         Anesthesia Quick Evaluation

## 2021-04-08 NOTE — H&P (Signed)
Adrian Byrd is an 70 y.o. male.   Chief Complaint: Back bilateral hip and leg pain HPI: 70 year old gentleman previously undergone an L3-S1 fusion did fairly well however over the last several weeks to months is a progressive worsening back and bilateral hip and leg pain and work-up is revealed segmental degeneration at L2-3 above his previous fusion.  Due to patient's progression of clinical syndrome imaging findings of a conservative treatment I recommended decompressive laminectomy and interbody fusion at L2-3.  I have extensively gone over the risks and benefits of that operation with him as well as perioperative course expectations of outcome and alternatives of surgery and he understood and agreed to proceed forward.  Past Medical History:  Diagnosis Date  . GERD (gastroesophageal reflux disease)   . High cholesterol   . History of kidney stones   . Neuromuscular disorder (HCC)   . Pneumonia     Past Surgical History:  Procedure Laterality Date  . ANTERIOR CERVICAL DECOMP/DISCECTOMY FUSION     x3  . BACK SURGERY    . CYSTOSCOPY/RETROGRADE/URETEROSCOPY Bilateral 08/22/2018   Procedure: CYSTOSCOPY/RETROGRADE/BILATERAL URETEROSCOPY AND LASER/RIGHT URETERAL STENT PLACEMENT;  Surgeon: Crista Elliot, MD;  Location: [facility];  Service: Urology;  Laterality: Bilateral;  . ESOPHAGEAL RECONSTRUCTION     2009 in [place], [place]  . HARDWARE REMOVAL N/A 06/27/2019   Procedure: Removal of posterior lumbar instrumentation;  Surgeon: Donalee Citrin, MD;  Location: [facility] OR;  Service: Neurosurgery;  Laterality: N/A;  . TONSILLECTOMY      History reviewed. No pertinent family history. Social History:  reports that he has quit smoking. He has quit using smokeless tobacco. He reports current alcohol use. He reports that he does not use drugs.  Allergies: No Known Allergies  Medications Prior to Admission  Medication Sig Dispense Refill  . Ascorbic Acid (VITAMIN C PO) Take 1 tablet by mouth daily.     Marland Kitchen atorvastatin (LIPITOR) 40 MG tablet Take 40 mg by mouth at bedtime.     Marland Kitchen omeprazole (PRILOSEC) 40 MG capsule Take 40 mg by mouth in the morning.    . psyllium (REGULOID) 0.52 g capsule Take 0.52 g by mouth 4 (four) times daily -  with meals and at bedtime.    Marland Kitchen acetaminophen (TYLENOL) 500 MG tablet Take 1,000 mg by mouth every 6 (six) hours as needed for moderate pain or headache.    . trolamine salicylate (ASPERCREME) 10 % cream Apply 1 application topically as needed for muscle pain. (Patient not taking: No sig reported)      Results for orders placed or performed during the hospital encounter of 04/08/21 (from the past 48 hour(s))  ABO/Rh     Status: None   Collection Time: 04/08/21  9:25 AM  Result Value Ref Range   ABO/RH(D)      B POS Performed at [facility], [address], [place], [place] [address]    No results found.  Review of Systems  Musculoskeletal: Positive for back pain.  Neurological: Positive for numbness.    Blood pressure (!) 154/70, pulse 62, temperature 97.6 F (36.4 C), temperature source Oral, resp. rate 18, height 6' (1.829 m), weight 98.9 kg, SpO2 97 %. Physical Exam HENT:     Head: Normocephalic.     Right Ear: Tympanic membrane normal.     Nose: Nose normal.     Mouth/Throat:     Mouth: Mucous membranes are moist.  Eyes:     Pupils: Pupils are equal, round,  and reactive to light.  Cardiovascular:     Rate and Rhythm: Normal rate.     Pulses: Normal pulses.  Pulmonary:     Effort: Pulmonary effort is normal.  Abdominal:     General: Abdomen is flat.  Musculoskeletal:        General: Normal range of motion.  Skin:    General: Skin is warm.  Neurological:     General: No focal deficit present.     Mental Status: He is alert.     Comments: Patient is awake and alert strength is 5 out of 5 iliopsoas, quads, hamstrings, gastrocs, into tibialis, and EHL.      Assessment/Plan 70 year old gentleman presents for decompression  fusion L2-3  Mariam Dollar, MD 04/08/2021, 11:32 AM

## 2021-04-08 NOTE — Anesthesia Postprocedure Evaluation (Signed)
Anesthesia Post Note  Patient: Adrian Byrd  Procedure(s) Performed: LUMBAR TWO-THREE POSTERIOR LUMBAR INTERBODY FUSION (N/A Spine Lumbar)     Patient location during evaluation: PACU Anesthesia Type: General Level of consciousness: awake and alert Pain management: pain level controlled Vital Signs Assessment: post-procedure vital signs reviewed and stable Respiratory status: spontaneous breathing, nonlabored ventilation, respiratory function stable and patient connected to nasal cannula oxygen Cardiovascular status: blood pressure returned to baseline and stable Postop Assessment: no apparent nausea or vomiting Anesthetic complications: no   No complications documented.  Last Vitals:  Vitals:   04/08/21 1618 04/08/21 1643  BP: (!) 121/59 (!) 147/74  Pulse: 71 75  Resp: 18 20  Temp: 37.1 C (!) 36.4 C  SpO2: 97% 95%    Last Pain:  Vitals:   04/08/21 1643  TempSrc: Oral  PainSc:                  Shelton Silvas

## 2021-04-08 NOTE — Anesthesia Procedure Notes (Signed)
Procedure Name: Intubation Date/Time: 04/08/2021 12:16 PM Performed by: Myna Bright, CRNA Pre-anesthesia Checklist: Patient identified, Emergency Drugs available, Suction available and Patient being monitored Patient Re-evaluated:Patient Re-evaluated prior to induction Oxygen Delivery Method: Circle system utilized Preoxygenation: Pre-oxygenation with 100% oxygen Induction Type: IV induction Ventilation: Mask ventilation without difficulty and Oral airway inserted - appropriate to patient size Laryngoscope Size: Mac and 4 Grade View: Grade I Tube type: Oral Tube size: 7.5 mm Number of attempts: 1 Airway Equipment and Method: Stylet Placement Confirmation: ETT inserted through vocal cords under direct vision,  breath sounds checked- equal and bilateral and positive ETCO2 Secured at: 22 cm Tube secured with: Tape Dental Injury: Teeth and Oropharynx as per pre-operative assessment

## 2021-04-08 NOTE — Transfer of Care (Signed)
Immediate Anesthesia Transfer of Care Note  Patient: Adrian Byrd  Procedure(s) Performed: LUMBAR TWO-THREE POSTERIOR LUMBAR INTERBODY FUSION (N/A Spine Lumbar)  Patient Location: PACU  Anesthesia Type:General  Level of Consciousness: awake, alert , oriented and patient cooperative  Airway & Oxygen Therapy: Patient Spontanous Breathing and Patient connected to nasal cannula oxygen  Post-op Assessment: Report given to RN and Post -op Vital signs reviewed and stable  Post vital signs: Reviewed and stable  Last Vitals:  Vitals Value Taken Time  BP 121/74 04/08/21 1505  Temp    Pulse 77 04/08/21 1505  Resp 16 04/08/21 1505  SpO2 93 % 04/08/21 1505  Vitals shown include unvalidated device data.  Last Pain:  Vitals:   04/08/21 0907  TempSrc: Oral         Complications: No complications documented.

## 2021-04-08 NOTE — Op Note (Signed)
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  2.  Posterior lumbar interbody fusion utilizing the globus [place] titanium cages packed with locally harvested autograft mixed with vivigen and protios  3.  Cortical screw fixation L2-3 utilizing the globus Creo amp modular cortical screw set with threaded locking caps  Surgeon: Jillyn Hidden Rhylan Gross  Assistant: Julien Girt  Anesthesia: General  EBL: 200  HPI: 6 years gentleman previously undergone L3-S1 fusion was doing very well however has progressively worsening back and bilateral hip and leg pain work-up revealed large disc herniation with severe spinal stenosis and instability at L2-3 above the level of his previous fusion.  Due to patient's progressive clinical syndrome imaging findings and failed conservative treatment I recommended decompressive laminectomy interbody fusion at L2-3.  I extensively went over the risks and benefits of that operation with him as well as perioperative course expectations of outcome and alternatives of surgery and he understood and agreed to proceed forward.  Operative procedure: Patient was brought into the OR was Duson general anesthesia positioned prone the Wilson frame his back was prepped and draped in routine sterile fashion utilizing bony landmarks a midline incision was drawn out infiltrated with 10 cc lidocaine with epi and an incision was made Bovie electrocautery was used to dissect through the subcutaneous tissue and subperiosteal dissection was carried out the lamina of L1-2 and 3 intraoperative x-ray identified the appropriate level of L2-3 then the spinous process at L2 was removed facets were  drilled down central decompression was begun I then performed complete medial facetectomies radical foraminotomies with complete removal of the pars interarticularis at L2-3.  Then there was marked spinal stenosis severe hourglass compression of thecal sac at this level after we performed the facetectomies aggressive under biting the supra articulating facet gained access lateral margin of disc base.  Disc base was incised and cleaned out bilaterally a large free fragment disclamation was removed the patient's left side then after adequate endplate preparation cages were inserted sequentially with extensive mount of autograft packed centrally then 10-second cortical screw placement cages were placed under fluoroscopy cortical screws were also placed under fluoroscopy.  All screws had excellent purchase and postop films confirmed the implants to be in good position all the foramina reinspected to confirm patency.  Gelfoam was ON top of the dura Exparel was injected in the fascia a medium Hemovac drain was placed and the wound was closed in layers with interrupted Vicryl in a running 4 subcuticular Dermabond benzoin Steri-Strips and a sterile dressing was applied and patient recovery in stable condition.  At the end of the case all needle count sponge counts were correct.

## 2021-04-09 DIAGNOSIS — M5126 Other intervertebral disc displacement, lumbar region: Secondary | ICD-10-CM | POA: Diagnosis not present

## 2021-04-09 MED ORDER — HYDROCODONE-ACETAMINOPHEN 5-325 MG PO TABS: 1.0000 | ORAL_TABLET | ORAL | 0 refills | Status: DC | PRN

## 2021-04-09 MED ORDER — CYCLOBENZAPRINE HCL 10 MG PO TABS: 10.0000 mg | ORAL_TABLET | Freq: Three times a day (TID) | ORAL | 0 refills | Status: DC | PRN

## 2021-04-09 NOTE — Discharge Summary (Signed)
  Physician Discharge Summary  Patient ID: Adrian Byrd MRN: [id number] DOB/AGE: 1951/07/04 70 y.o. Estimated body mass index is 29.57 kg/m as calculated from the following:   Height as of this encounter: 6' (1.829 m).   Weight as of this encounter: 98.9 kg.   Admit date: 04/08/2021 Discharge date: 04/09/2021  Admission Diagnoses: Herniated nucleus pulposus L2-3 lumbar spinal stenosis and instability  Discharge Diagnoses: Same Active Problems:   HNP (herniated nucleus pulposus), lumbar   Discharged Condition: good  Hospital Course: Patient is admitted to hospital underwent decompressive laminectomy and posterior lumbar interbody fusion L2-3.  Postoperative patient did very well covering for on the floor was ambulating and voiding spontaneously tolerating regular diet and stable for discharge home.  Patient was discharged scheduled follow-up 1 to 2 weeks.  Consults: Significant Diagnostic Studies: Treatments: L2-3 decompression fusion Discharge Exam: Blood pressure (!) 119/58, pulse 69, temperature (!) 97.5 F (36.4 C), temperature source Oral, resp. rate 18, height 6' (1.829 m), weight 98.9 kg, SpO2 98 %. Strength 5-5 on clean dry and intact  Disposition: Home   Allergies as of 04/09/2021   No Known Allergies     Medication List    TAKE these medications   acetaminophen 500 MG tablet Commonly known as: TYLENOL Take 1,000 mg by mouth every 6 (six) hours as needed for moderate pain or headache.   atorvastatin 40 MG tablet Commonly known as: LIPITOR Take 40 mg by mouth at bedtime.   cyclobenzaprine 10 MG tablet Commonly known as: FLEXERIL Take 1 tablet (10 mg total) by mouth 3 (three) times daily as needed for muscle spasms.   HYDROcodone-acetaminophen 5-325 MG tablet Commonly known as: NORCO/VICODIN Take 1-2 tablets by mouth every 4 (four) hours as needed for moderate pain.   omeprazole 40 MG capsule Commonly known as: PRILOSEC Take 40 mg by mouth in the  morning.   psyllium 0.52 g capsule Commonly known as: REGULOID Take 0.52 g by mouth 4 (four) times daily -  with meals and at bedtime.   trolamine salicylate 10 % cream Commonly known as: ASPERCREME Apply 1 application topically as needed for muscle pain.   VITAMIN C PO Take 1 tablet by mouth daily.        Signed: Mariam Dollar 04/09/2021, 7:56 AM

## 2021-04-09 NOTE — Evaluation (Signed)
Physical Therapy Evaluation Patient Details Name: Adrian Byrd MRN: [id number] DOB: [birth year] Today's Date: 04/09/2021   History of Present Illness  70 year old gentleman with a previous L3-S1 fusion, presented with progressive worsening back, bilateral hip and leg pain.  Underwent decompressive laminectomy and interbody fusion at L2-3.  Clinical Impression  Patient evaluated by Physical Therapy with no further acute PT needs identified. All education has been completed and the patient has no further questions. Pt was able to demonstrate transfers and ambulation with gross modified independence to independence and no AD. Pt was educated on precautions, brace application/wearing schedule, appropriate activity progression, and car transfer. See below for any follow-up Physical Therapy or equipment needs. PT is signing off. Thank you for this referral.     Follow Up Recommendations No PT follow up;Supervision - Intermittent    Equipment Recommendations  None recommended by PT    Recommendations for Other Services       Precautions / Restrictions Precautions Precautions: Back Precaution Booklet Issued: Yes (comment) Required Braces or Orthoses: Spinal Brace Spinal Brace: Lumbar corset Restrictions Weight Bearing Restrictions: No      Mobility  Bed Mobility Overal bed mobility: Modified Independent             General bed mobility comments: Pt was received sitting up EOB.    Transfers Overall transfer level: Independent Equipment used: None                Ambulation/Gait Ambulation/Gait assistance: Modified independent (Device/Increase time) Gait Distance (Feet): 500 Feet Assistive device: None Gait Pattern/deviations: Step-through pattern;Decreased stride length;Trunk flexed Gait velocity: Mildly decreased but WFL Gait velocity interpretation: 1.31 - 2.62 ft/sec, indicative of limited community ambulator General Gait Details: Pt with mildly flexed trunk  throughout ambulation but was not able to make corrective changes with cues.  Stairs            Wheelchair Mobility    Modified Rankin (Stroke Patients Only)       Balance Overall balance assessment: No apparent balance deficits (not formally assessed)                                           Pertinent Vitals/Pain Pain Assessment: Faces Pain Score: 2  Faces Pain Scale: Hurts a little bit Pain Location: incisional Pain Descriptors / Indicators: Tender Pain Intervention(s): Limited activity within patient's tolerance;Monitored during session;Repositioned    Home Living Family/patient expects to be discharged to:: Private residence Living Arrangements: Spouse/significant other Available Help at Discharge: Family;Available 24 hours/day Type of Home: House Home Access: Stairs to enter   [employer] of Steps: 1 Home Layout: One level Home Equipment: Adaptive equipment      Prior Function Level of Independence: Independent               Hand Dominance   Dominant Hand: Right    Extremity/Trunk Assessment   Upper Extremity Assessment Upper Extremity Assessment: Defer to OT evaluation    Lower Extremity Assessment Lower Extremity Assessment: Overall WFL for tasks assessed    Cervical / Trunk Assessment Cervical / Trunk Assessment: Other exceptions Cervical / Trunk Exceptions: s/p spinal surgery  Communication   Communication: No difficulties  Cognition Arousal/Alertness: Awake/alert Behavior During Therapy: WFL for tasks assessed/performed Overall Cognitive Status: Within Functional Limits for tasks assessed  General Comments      Exercises     Assessment/Plan    PT Assessment Patent does not need any further PT services  PT Problem List         PT Treatment Interventions      PT Goals (Current goals can be found in the Care Plan section)  Acute Rehab PT  Goals Patient Stated Goal: Return home and recover PT Goal Formulation: All assessment and education complete, DC therapy    Frequency     Barriers to discharge        Co-evaluation               AM-PAC PT "6 Clicks" Mobility  Outcome Measure Help needed turning from your back to your side while in a flat bed without using bedrails?: None Help needed moving from lying on your back to sitting on the side of a flat bed without using bedrails?: None Help needed moving to and from a bed to a chair (including a wheelchair)?: None Help needed standing up from a chair using your arms (e.g., wheelchair or bedside chair)?: None Help needed to walk in hospital room?: None Help needed climbing 3-5 steps with a railing? : None 6 Click Score: 24    End of Session Equipment Utilized During Treatment: Back brace Activity Tolerance: Patient tolerated treatment well Patient left: Other (comment) (Pt discharging) Nurse Communication: Mobility status PT Visit Diagnosis: Unsteadiness on feet (R26.81);Pain Pain - part of body:  (back)    Time: 5732-2025 PT Time Calculation (min) (ACUTE ONLY): 13 min   Charges:   PT Evaluation $PT Eval Low Complexity: 1 Low          Conni Slipper, PT, DPT Acute Rehabilitation Services Pager: [phone] Office: [phone]   Marylynn Pearson 04/09/2021, 12:46 PM

## 2021-04-09 NOTE — Progress Notes (Signed)
Patient is discharged from room 3C05 at this time. Alert and in stable condition. IV site d/c'd and instructions read to patient with understanding verbalized and all questions answered. Left unit via wheelchair with all belongings at side.  

## 2021-04-09 NOTE — Evaluation (Signed)
Occupational Therapy Evaluation Patient Details Name: Adrian Byrd MRN: [id number] DOB: 1951/11/14 Today's Date: 04/09/2021    History of Present Illness 70 year old gentleman with a previous L3-S1 fusion, presented with progressive worsening back, bilateral hip and leg pain.  Underwent decompressive laminectomy and interbody fusion at L2-3.   Clinical Impression   Patient admitted for the diagnosis and procedure above.  Patient is presenting at or near his baseline for ADL and in room mobility/toileting.  No further acute needs identified.  All questions answered, and precaution sheet left bedside.  PT eval pending.      Follow Up Recommendations  No OT follow up    Equipment Recommendations  None recommended by OT    Recommendations for Other Services       Precautions / Restrictions Precautions Precautions: Back Precaution Booklet Issued: Yes (comment) Required Braces or Orthoses: Spinal Brace Spinal Brace: Lumbar corset Restrictions Weight Bearing Restrictions: No      Mobility Bed Mobility Overal bed mobility: Modified Independent               Patient Response: Cooperative  Transfers Overall transfer level: Independent Equipment used: None                  Balance Overall balance assessment: No apparent balance deficits (not formally assessed)                                         ADL either performed or assessed with clinical judgement   ADL Overall ADL's : At baseline                                       General ADL Comments: Able to complete ADL from modified sit/stand level.  Mobility in room without assist.  No issues with bed mobility.     Vision Patient Visual Report: No change from baseline       Perception     Praxis      Pertinent Vitals/Pain Pain Assessment: 0-10 Pain Score: 2  Faces Pain Scale: Hurts a little bit Pain Location: incisional Pain Descriptors / Indicators:  Tender Pain Intervention(s): Monitored during session     Hand Dominance Right   Extremity/Trunk Assessment Upper Extremity Assessment Upper Extremity Assessment: Overall WFL for tasks assessed   Lower Extremity Assessment Lower Extremity Assessment: Defer to PT evaluation   Cervical / Trunk Assessment Cervical / Trunk Assessment: Other exceptions (spinal surgery)   Communication Communication Communication: No difficulties   Cognition Arousal/Alertness: Awake/alert Behavior During Therapy: WFL for tasks assessed/performed Overall Cognitive Status: Within Functional Limits for tasks assessed                                                      Home Living Family/patient expects to be discharged to:: Private residence Living Arrangements: Spouse/significant other Available Help at Discharge: Family;Available 24 hours/day Type of Home: House Home Access: Stairs to enter [employer] of Steps: 1   Home Layout: One level     Bathroom Shower/Tub: Chief Strategy Officer: Standard     Home Equipment: Scientist, physiological Equipment: Reacher;Long-handled sponge  Prior Functioning/Environment Level of Independence: Independent                 OT Problem List: Pain      OT Treatment/Interventions:      OT Goals(Current goals can be found in the care plan section) Acute Rehab OT Goals Patient Stated Goal: Return home and recover OT Goal Formulation: With patient Time For Goal Achievement: 04/09/21 Potential to Achieve Goals: Good  OT Frequency:     Barriers to D/C:  none noted          Co-evaluation              AM-PAC OT "6 Clicks" Daily Activity     Outcome Measure Help from another person eating meals?: None Help from another person taking care of personal grooming?: None Help from another person toileting, which includes using toliet, bedpan, or urinal?: None Help from another person  bathing (including washing, rinsing, drying)?: None Help from another person to put on and taking off regular upper body clothing?: None Help from another person to put on and taking off regular lower body clothing?: None 6 Click Score: 24   End of Session Equipment Utilized During Treatment: Back brace Nurse Communication: Mobility status  Activity Tolerance: Patient tolerated treatment well Patient left: in bed;with call bell/phone within reach  OT Visit Diagnosis: Pain Pain - Right/Left:  (low back)                Time: 6226-3335 OT Time Calculation (min): 29 min Charges:  OT General Charges $OT Visit: 1 Visit OT Evaluation $OT Eval Moderate Complexity: 1 Mod OT Treatments $Self Care/Home Management : 8-22 mins  04/09/2021  Rich, OTR/L  Acute Rehabilitation Services  Office:  [phone]   Suzanna Obey 04/09/2021, 9:26 AM

## 2021-04-09 NOTE — Progress Notes (Signed)
Orthopedic Tech Progress Note Patient Details:  Adrian Byrd [birth year] [id number] RN said patient has brace Patient ID: Adrian Byrd, male   DOB: 1951/08/17, 70 y.o.   MRN: [id number]   Adrian Byrd 04/09/2021, 7:19 AM

## 2021-04-09 NOTE — Discharge Instructions (Signed)
Wound Care  Keep the incision clean and dry remove the outer dressing in 2-3 days, leave the Steri-Strips intact. Wrap with Saran wrap for showers only Do not put any creams, lotions, or ointments on incision. Leave steri-strips on back.  They will fall off by themselves.  Activity Walk each and every day, increasing distance each day. No lifting greater than 5 lbs.  No lifting no bending no twisting no driving or riding a car unless coming back and forth to see me. If provided with back brace, wear when out of bed.  It is not necessary to wear brace in bed. Diet Resume your normal diet.   Return to Work Will be discussed at you follow up appointment.  Call Your Doctor If Any of These Occur Redness, drainage, or swelling at the wound.  Temperature greater than 101 degrees. Severe pain not relieved by pain medication. Incision starts to come apart. Follow Up Appt Call today for appointment in 1-2 weeks [phone]) or for problems.  If you have any hardware placed in your spine, you will need an x-ray before your appointment.   

## 2021-06-09 ENCOUNTER — Other Ambulatory Visit (HOSPITAL_COMMUNITY): Payer: Self-pay | Admitting: Neurosurgery

## 2021-06-09 DIAGNOSIS — R609 Edema, unspecified: Secondary | ICD-10-CM

## 2021-06-09 DIAGNOSIS — M48062 Spinal stenosis, lumbar region with neurogenic claudication: Secondary | ICD-10-CM

## 2021-06-10 ENCOUNTER — Other Ambulatory Visit: Payer: Self-pay

## 2021-06-10 ENCOUNTER — Ambulatory Visit (HOSPITAL_COMMUNITY)
Admission: RE | Admit: 2021-06-10 | Discharge: 2021-06-10 | Disposition: A | Discharge status: Home / Self Care | Payer: Medicare Other | Source: Ambulatory Visit | Attending: Neurosurgery | Admitting: Neurosurgery

## 2021-06-10 DIAGNOSIS — R609 Edema, unspecified: Secondary | ICD-10-CM | POA: Insufficient documentation

## 2021-06-10 NOTE — Progress Notes (Signed)
BLE venous duplex has been completed.   Results can be found under chart review under CV PROC. 06/10/2021 11:20 AM Alacia Rehmann RVT, RDMS

## 2021-06-12 ENCOUNTER — Ambulatory Visit (HOSPITAL_COMMUNITY)
Admission: RE | Admit: 2021-06-12 | Discharge: 2021-06-12 | Disposition: A | Discharge status: Home / Self Care | Payer: Medicare Other | Source: Ambulatory Visit | Attending: Neurosurgery | Admitting: Neurosurgery

## 2021-06-12 ENCOUNTER — Other Ambulatory Visit: Payer: Self-pay

## 2021-06-12 DIAGNOSIS — M48062 Spinal stenosis, lumbar region with neurogenic claudication: Secondary | ICD-10-CM | POA: Diagnosis not present

## 2021-07-30 ENCOUNTER — Other Ambulatory Visit: Payer: Self-pay | Admitting: Neurosurgery

## 2021-07-30 DIAGNOSIS — M5440 Lumbago with sciatica, unspecified side: Secondary | ICD-10-CM

## 2021-08-08 ENCOUNTER — Ambulatory Visit
Admission: RE | Admit: 2021-08-08 | Discharge: 2021-08-08 | Disposition: A | Discharge status: Home / Self Care | Payer: Medicare Other | Source: Ambulatory Visit | Attending: Neurosurgery | Admitting: Neurosurgery

## 2021-08-08 ENCOUNTER — Other Ambulatory Visit: Payer: Self-pay

## 2021-08-08 DIAGNOSIS — M5440 Lumbago with sciatica, unspecified side: Secondary | ICD-10-CM

## 2022-03-09 IMAGING — CT CT L SPINE W/O CM
1 of 8 series · 5 of 14 positions shown, 7 images · non-contrast
Comparison: CT lumbar spine 06/12/2021

CLINICAL DATA: Lower back pain since lumbar surgery [REDACTED]

EXAM:
CT LUMBAR SPINE WITHOUT CONTRAST
TECHNIQUE: Multidetector CT imaging of the lumbar spine was performed without
intravenous contrast administration. Multiplanar CT image
reconstructions were also generated.

[Series 3: l spine soft · axial · 0.40mm/px · z∈[-250,-74]mm · 5 of 133 slices shown, 7 images]
[im 23/133  soft-tissue]
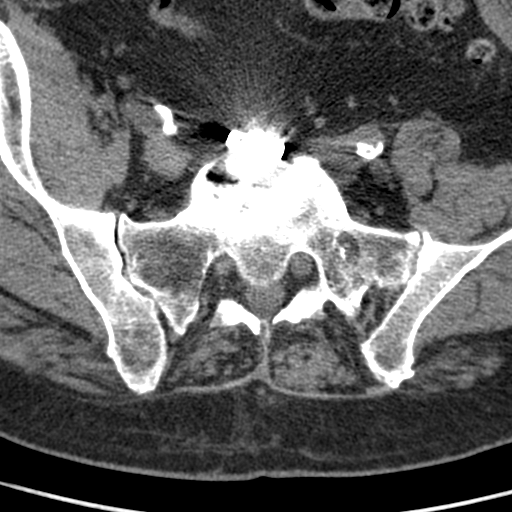
[im 23/133  bone]
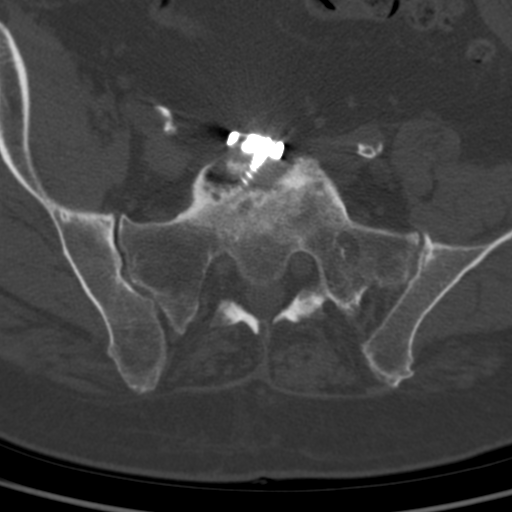
[im 45/133  bone]
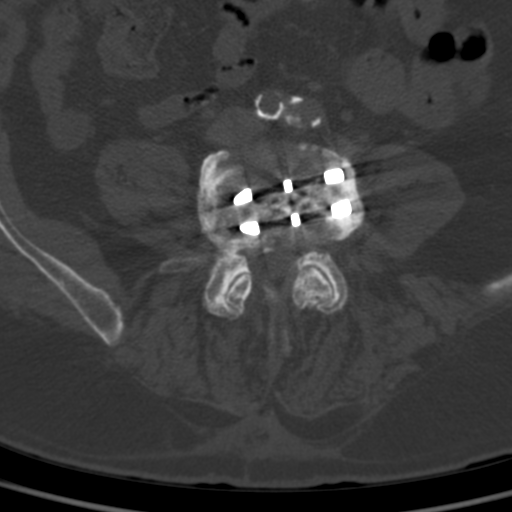
[im 67/133  bone]
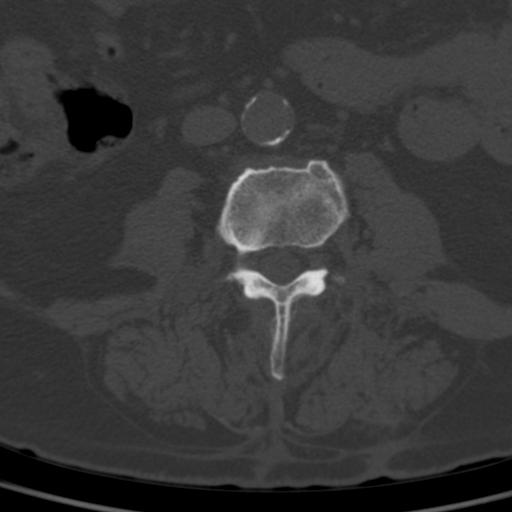
[im 89/133  bone]
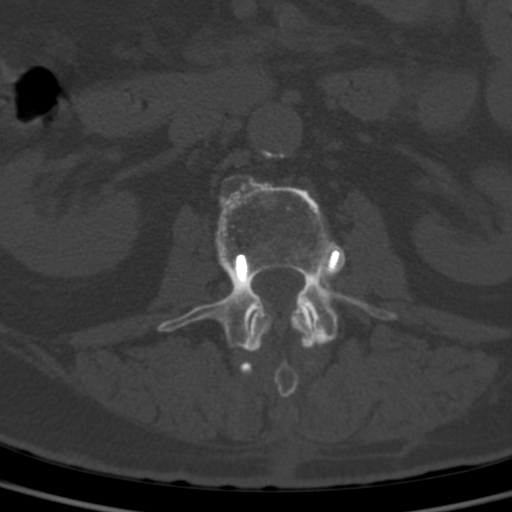
[im 111/133  soft-tissue]
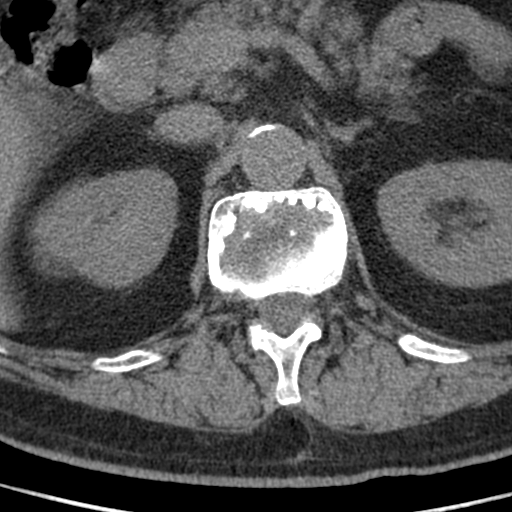
[im 111/133  bone]
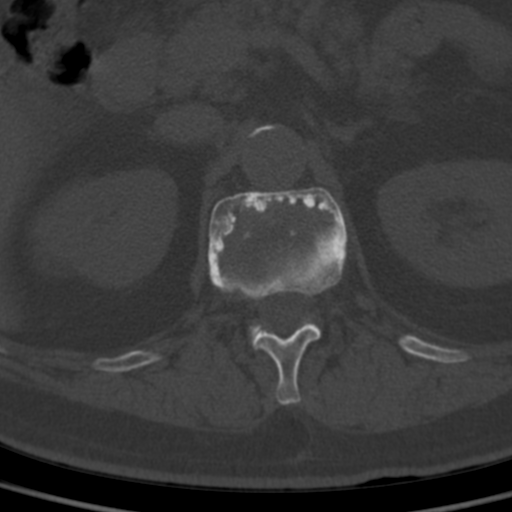

[5 of 14 positions shown; findings below may reference images not displayed]

FINDINGS: Segmentation: 5 lumbar type vertebrae.

Alignment: Unchanged alignment. Straightening of the lumbar
lordosis. Trace residual retrolisthesis at L2-L3.

Vertebrae: Postsurgical changes of interbody fusion at L3-L4 and
L4-L5, anterior and interbody fusion at L5-S1, and more recent
posterior and interbody fusion at L2-L3 with bilateral
laminectomies. Hardware is intact without evidence of displacement.
There is unchanged mild lucency along the undersurface of the S1
screw adjacent to the plate. There is minimal lucency along the left
L2 pedicle screw, unchanged from prior.

Paraspinal and other soft tissues: There is a 1.0 cm nonobstructive
left lower pole renal stone, unchanged. Aortoiliac atherosclerotic
calcifications. Sigmoid diverticulosis.

Disc levels:

L1-L2: No significant spinal canal or neural foraminal narrowing.

L2-L3: Canal is been decompressed. Foramina are slightly obscured by
artifact, but there is no evidence of significant neural foraminal
narrowing.

L3-L4: Bilateral facet arthropathy. No spinal canal stenosis.
Unchanged mild right neural foraminal narrowing.

L4-L5: Bilateral facet arthropathy. No spinal canal stenosis.
Moderate bilateral foraminal stenosis, left greater than right,
unchanged.

L5-S1: Mild facet arthropathy. No spinal canal stenosis. Stable
moderate-severe neural foraminal narrowing, left greater than right.
IMPRESSION: Stable lumbar fusion hardware with minimal lucency along the left L2
pedicle screw and mild lucency along the anterior S1 screw. Hardware
is otherwise intact without evidence of displacement.

Stable neural foraminal narrowing at L5 and S1 due to bony spurring.

Unchanged nonobstructive 1.0 cm left lower pole renal stone.

## 2022-10-26 ENCOUNTER — Other Ambulatory Visit: Payer: Self-pay | Admitting: Neurosurgery

## 2022-10-26 DIAGNOSIS — M5440 Lumbago with sciatica, unspecified side: Secondary | ICD-10-CM

## 2022-11-26 ENCOUNTER — Other Ambulatory Visit: Payer: Self-pay | Admitting: Neurosurgery

## 2022-11-29 ENCOUNTER — Other Ambulatory Visit: Payer: Medicare Other

## 2022-12-20 NOTE — Pre-Procedure Instructions (Signed)
Surgical Instructions    Your procedure is scheduled on Friday January 12.  Report to [facility] Main Entrance "A" at 5:30 A.M., then check in with the Admitting office.  Call this number if you have problems the morning of surgery:  [phone]   If you have any questions prior to your surgery date call [phone]: Open Monday-Friday 8am-4pm If you experience any cold or flu symptoms such as cough, fever, chills, shortness of breath, etc. between now and your scheduled surgery, please notify us at the above number     Remember:  Do not eat or drink after midnight the night before your surgery      Take these medicines the morning of surgery with A SIP OF WATER:  omeprazole (PRILOSEC)  acetaminophen (TYLENOL) if needed  As of today, STOP taking any Aspirin (unless otherwise instructed by your surgeon), Diclofenac Sodium 3 % GEL, Aleve, Naproxen, Ibuprofen, Motrin, Advil, Goody's, BC's, all herbal medications, fish oil, and all vitamins.   [place] is not responsible for any belongings or valuables.    Do NOT Smoke (Tobacco/Vaping)  24 hours prior to your procedure  If you use a CPAP at night, you may bring your mask for your overnight stay.   Contacts, glasses, hearing aids, dentures or partials may not be worn into surgery, please bring cases for these belongings   For patients admitted to the hospital, discharge time will be determined by your treatment team.   Patients discharged the day of surgery will not be allowed to drive home, and someone needs to stay with them for 24 hours.   SURGICAL WAITING ROOM VISITATION Patients having surgery or a procedure may have no more than 2 support people in the waiting area - these visitors may rotate.   Children under the age of 68 must have an adult with them who is not the patient. If the patient needs to stay at the hospital during part of their recovery, the visitor guidelines for inpatient rooms apply. Pre-op nurse will  coordinate an appropriate time for 1 support person to accompany patient in pre-op.  This support person may not rotate.   Please refer to [email or web address] for the visitor guidelines for Inpatients (after your surgery is over and you are in a regular room).    Special instructions:    Oral Hygiene is also important to reduce your risk of infection.  Remember - BRUSH YOUR TEETH THE MORNING OF SURGERY WITH YOUR REGULAR TOOTHPASTE   [facility]- Preparing For Surgery  Before surgery, you can play an important role. Because skin is not sterile, your skin needs to be as free of germs as possible. You can reduce the number of germs on your skin by washing with CHG (chlorahexidine gluconate) Soap before surgery.  CHG is an antiseptic cleaner which kills germs and bonds with the skin to continue killing germs even after washing.     Please do not use if you have an allergy to CHG or antibacterial soaps. If your skin becomes reddened/irritated stop using the CHG.  Do not shave (including legs and underarms) for at least 48 hours prior to first CHG shower. It is OK to shave your face.  Please follow these instructions carefully.     Shower the NIGHT BEFORE SURGERY and the MORNING OF SURGERY with CHG Soap.   If you chose to wash your hair, wash your hair first as usual with your normal shampoo. After you shampoo, rinse your hair and body thoroughly  to remove the shampoo.  Then [employer] and genitals (private parts) with your normal soap and rinse thoroughly to remove soap.  After that Use CHG Soap as you would any other liquid soap. You can apply CHG directly to the skin and wash gently with a scrungie or a clean washcloth.   Apply the CHG Soap to your body ONLY FROM THE NECK DOWN.  Do not use on open wounds or open sores. Avoid contact with your eyes, ears, mouth and genitals (private parts). Wash Face and genitals (private parts)  with your normal  soap.   Wash thoroughly, paying special attention to the area where your surgery will be performed.  Thoroughly rinse your body with warm water from the neck down.  DO NOT shower/wash with your normal soap after using and rinsing off the CHG Soap.  Pat yourself dry with a CLEAN TOWEL.  Wear CLEAN PAJAMAS to bed the night before surgery  Place CLEAN SHEETS on your bed the night before your surgery  DO NOT SLEEP WITH PETS.   Day of Surgery:  Take a shower with CHG soap. Wear Clean/Comfortable clothing the morning of surgery Do not wear jewelry or makeup. Do not wear lotions, powders, perfumes/cologne or deodorant. Do not shave 48 hours prior to surgery.  Men may shave face and neck. Do not bring valuables to the hospital. Do not wear nail polish, gel polish, artificial nails, or any other type of covering on natural nails (fingers and toes) If you have artificial nails or gel coating that need to be removed by a nail salon, please have this removed prior to surgery. Artificial nails or gel coating may interfere with anesthesia's ability to adequately monitor your vital signs. Remember to brush your teeth WITH YOUR REGULAR TOOTHPASTE.    If you received a COVID test during your pre-op visit, it is requested that you wear a mask when out in public, stay away from anyone that may not be feeling well, and notify your surgeon if you develop symptoms. If you have been in contact with anyone that has tested positive in the last 10 days, please notify your surgeon.    Please read over the following fact sheets that you were given.

## 2022-12-21 ENCOUNTER — Other Ambulatory Visit: Payer: Self-pay

## 2022-12-21 ENCOUNTER — Encounter (HOSPITAL_COMMUNITY): Payer: Self-pay

## 2022-12-21 ENCOUNTER — Encounter (HOSPITAL_COMMUNITY)
Admission: RE | Admit: 2022-12-21 | Discharge: 2022-12-21 | Disposition: A | Discharge status: Home / Self Care | Payer: Medicare Other | Source: Ambulatory Visit | Attending: Neurosurgery | Admitting: Neurosurgery

## 2022-12-21 VITALS — BP 146/67 | HR 83 | Temp 97.9°F | Resp 18 | Ht 72.0 in | Wt 220.0 lb

## 2022-12-21 DIAGNOSIS — Z01812 Encounter for preprocedural laboratory examination: Secondary | ICD-10-CM | POA: Insufficient documentation

## 2022-12-21 DIAGNOSIS — Z01818 Encounter for other preprocedural examination: Secondary | ICD-10-CM

## 2022-12-21 LAB — CBC
HCT: 44 % (ref 39.0–52.0)
Hemoglobin: 14.8 g/dL (ref 13.0–17.0)
MCH: 31 pg (ref 26.0–34.0)
MCHC: 33.6 g/dL (ref 30.0–36.0)
MCV: 92.1 fL (ref 80.0–100.0)
Platelets: 262 10*3/uL (ref 150–400)
RBC: 4.78 MIL/uL (ref 4.22–5.81)
RDW: 13.1 % (ref 11.5–15.5)
WBC: 9.1 10*3/uL (ref 4.0–10.5)
nRBC: 0 % (ref 0.0–0.2)

## 2022-12-21 LAB — SURGICAL PCR SCREEN
MRSA, PCR: NEGATIVE
Staphylococcus aureus: NEGATIVE

## 2022-12-21 LAB — TYPE AND SCREEN
ABO/RH(D): B POS
Antibody Screen: NEGATIVE

## 2022-12-21 NOTE — Progress Notes (Signed)
PCP: Bradly Bienenstock NP, [facility] Cardiologist: denies  EKG: n/a CXR: n/a ECHO: denies Stress Test: "Several years ago, reports it was normal, no f/u needed" Cardiac Cath: denies  ERAS: NPO  Patient denies shortness of breath, fever, cough, and chest pain at PAT appointment.  Patient verbalized understanding of instructions provided today at the PAT appointment.  Patient asked to review instructions at home and day of surgery.

## 2022-12-23 NOTE — Anesthesia Preprocedure Evaluation (Addendum)
Anesthesia Evaluation  Patient identified by MRN, date of birth, ID band Patient awake    Reviewed: Allergy & Precautions, NPO status , Patient's Chart, lab work & pertinent test results  Airway Mallampati: III  TM Distance: >3 FB Neck ROM: Limited    Dental  (+) Upper Dentures, Lower Dentures   Pulmonary former smoker   Pulmonary exam normal        Cardiovascular negative cardio ROS  Rhythm:Regular Rate:Normal     Neuro/Psych negative neurological ROS  negative psych ROS   GI/Hepatic Neg liver ROS,GERD  Medicated,,  Endo/Other  negative endocrine ROS    Renal/GU negative Renal ROS  negative genitourinary   Musculoskeletal  (+) Arthritis , Osteoarthritis,    Abdominal Normal abdominal exam  (+)   Peds  Hematology negative hematology ROS (+)   Anesthesia Other Findings   Reproductive/Obstetrics                             Anesthesia Physical Anesthesia Plan  ASA: 2  Anesthesia Plan: General   Post-op Pain Management: Celebrex PO (pre-op)* and Tylenol PO (pre-op)*   Induction: Intravenous  PONV Risk Score and Plan: 2 and Ondansetron and Dexamethasone  Airway Management Planned: Mask and Oral ETT  Additional Equipment: None  Intra-op Plan:   Post-operative Plan: Extubation in OR  Informed Consent: I have reviewed the patients History and Physical, chart, labs and discussed the procedure including the risks, benefits and alternatives for the proposed anesthesia with the patient or authorized representative who has indicated his/her understanding and acceptance.     Dental advisory given  Plan Discussed with: CRNA  Anesthesia Plan Comments: (Lab Results      Component                Value               Date                      WBC                      9.1                 12/21/2022                HGB                      14.8                12/21/2022                HCT                       44.0                12/21/2022                MCV                      92.1                12/21/2022                PLT                      262  12/21/2022             Lab Results      Component                Value               Date                      NA                       140                 04/07/2021                K                        3.9                 04/07/2021                CO2                      28                  04/07/2021                GLUCOSE                  114 (H)             04/07/2021                BUN                      13                  04/07/2021                CREATININE               1.02                04/07/2021                CALCIUM                  9.0                 04/07/2021                GFRNONAA                 >60                 04/07/2021           )       Anesthesia Quick Evaluation

## 2022-12-24 ENCOUNTER — Other Ambulatory Visit: Payer: Self-pay

## 2022-12-24 ENCOUNTER — Inpatient Hospital Stay (HOSPITAL_COMMUNITY): Payer: Medicare Other | Admitting: Certified Registered Nurse Anesthetist

## 2022-12-24 ENCOUNTER — Encounter (HOSPITAL_COMMUNITY): Payer: Self-pay | Admitting: Neurosurgery

## 2022-12-24 ENCOUNTER — Inpatient Hospital Stay (HOSPITAL_COMMUNITY): Payer: Medicare Other

## 2022-12-24 ENCOUNTER — Ambulatory Visit (HOSPITAL_COMMUNITY)
Admission: RE | Admit: 2022-12-24 | Discharge: 2022-12-24 | Disposition: A | Discharge status: Home / Self Care | Payer: Medicare Other | Source: Ambulatory Visit | Attending: Neurosurgery | Admitting: Neurosurgery

## 2022-12-24 ENCOUNTER — Encounter (HOSPITAL_COMMUNITY)
Admission: RE | Disposition: A | Discharge status: Home / Self Care | Payer: Self-pay | Source: Ambulatory Visit | Attending: Neurosurgery

## 2022-12-24 ENCOUNTER — Inpatient Hospital Stay (HOSPITAL_BASED_OUTPATIENT_CLINIC_OR_DEPARTMENT_OTHER): Payer: Medicare Other | Admitting: Certified Registered Nurse Anesthetist

## 2022-12-24 DIAGNOSIS — T8484XA Pain due to internal orthopedic prosthetic devices, implants and grafts, initial encounter: Secondary | ICD-10-CM | POA: Diagnosis not present

## 2022-12-24 DIAGNOSIS — Y793 Surgical instruments, materials and orthopedic devices (including sutures) associated with adverse incidents: Secondary | ICD-10-CM | POA: Diagnosis not present

## 2022-12-24 DIAGNOSIS — Z87891 Personal history of nicotine dependence: Secondary | ICD-10-CM | POA: Diagnosis not present

## 2022-12-24 DIAGNOSIS — M960 Pseudarthrosis after fusion or arthrodesis: Secondary | ICD-10-CM | POA: Diagnosis present

## 2022-12-24 DIAGNOSIS — K219 Gastro-esophageal reflux disease without esophagitis: Secondary | ICD-10-CM | POA: Insufficient documentation

## 2022-12-24 DIAGNOSIS — T84498A Other mechanical complication of other internal orthopedic devices, implants and grafts, initial encounter: Secondary | ICD-10-CM | POA: Insufficient documentation

## 2022-12-24 DIAGNOSIS — Y838 Other surgical procedures as the cause of abnormal reaction of the patient, or of later complication, without mention of misadventure at the time of the procedure: Secondary | ICD-10-CM | POA: Diagnosis not present

## 2022-12-24 DIAGNOSIS — S32009K Unspecified fracture of unspecified lumbar vertebra, subsequent encounter for fracture with nonunion: Principal | ICD-10-CM | POA: Diagnosis present

## 2022-12-24 HISTORY — PX: LAMINECTOMY WITH POSTERIOR LATERAL ARTHRODESIS LEVEL 2: SHX6336

## 2022-12-24 SURGERY — LAMINECTOMY WITH POSTERIOR LATERAL ARTHRODESIS LEVEL 2
Anesthesia: General | Site: Back | Laterality: Left

## 2022-12-24 MED ORDER — TROLAMINE SALICYLATE 10 % EX CREA: 1.0000 | TOPICAL_CREAM | CUTANEOUS | Status: DC | PRN

## 2022-12-24 MED ORDER — MIDAZOLAM HCL 2 MG/2ML IJ SOLN
INTRAMUSCULAR | Status: AC
  Filled 2022-12-24: qty 2

## 2022-12-24 MED ORDER — DEXAMETHASONE SODIUM PHOSPHATE 10 MG/ML IJ SOLN
INTRAMUSCULAR | Status: AC
  Filled 2022-12-24: qty 1

## 2022-12-24 MED ORDER — LIDOCAINE 2% (20 MG/ML) 5 ML SYRINGE
INTRAMUSCULAR | Status: DC | PRN
  Administered 2022-12-24: 60 mg via INTRAVENOUS

## 2022-12-24 MED ORDER — BUPIVACAINE HCL (PF) 0.25 % IJ SOLN
INTRAMUSCULAR | Status: AC
  Filled 2022-12-24: qty 30

## 2022-12-24 MED ORDER — CEFAZOLIN SODIUM-DEXTROSE 2-4 GM/100ML-% IV SOLN
2.0000 g | INTRAVENOUS | Status: AC
  Administered 2022-12-24: 2 g via INTRAVENOUS
  Filled 2022-12-24: qty 100

## 2022-12-24 MED ORDER — OXYCODONE HCL 5 MG PO TABS
5.0000 mg | ORAL_TABLET | Freq: Once | ORAL | Status: AC | PRN
  Administered 2022-12-24: 5 mg via ORAL

## 2022-12-24 MED ORDER — SODIUM CHLORIDE 0.9 % IV SOLN
250.0000 mL | INTRAVENOUS | Status: DC
  Administered 2022-12-24: 250 mL via INTRAVENOUS

## 2022-12-24 MED ORDER — ACETAMINOPHEN 650 MG RE SUPP: 650.0000 mg | RECTAL | Status: DC | PRN

## 2022-12-24 MED ORDER — EPHEDRINE SULFATE-NACL 50-0.9 MG/10ML-% IV SOSY
PREFILLED_SYRINGE | INTRAVENOUS | Status: DC | PRN
  Administered 2022-12-24: 10 mg via INTRAVENOUS

## 2022-12-24 MED ORDER — LACTATED RINGERS IV SOLN: INTRAVENOUS | Status: DC

## 2022-12-24 MED ORDER — ACETAMINOPHEN 500 MG PO TABS: 1000.0000 mg | ORAL_TABLET | Freq: Four times a day (QID) | ORAL | Status: DC | PRN

## 2022-12-24 MED ORDER — ACETAMINOPHEN 325 MG PO TABS: 650.0000 mg | ORAL_TABLET | ORAL | Status: DC | PRN

## 2022-12-24 MED ORDER — ROCURONIUM BROMIDE 10 MG/ML (PF) SYRINGE
PREFILLED_SYRINGE | INTRAVENOUS | Status: DC | PRN
  Administered 2022-12-24: 60 mg via INTRAVENOUS

## 2022-12-24 MED ORDER — METHOCARBAMOL 750 MG PO TABS: 750.0000 mg | ORAL_TABLET | Freq: Four times a day (QID) | ORAL | 0 refills | Status: DC

## 2022-12-24 MED ORDER — SUGAMMADEX SODIUM 200 MG/2ML IV SOLN
INTRAVENOUS | Status: DC | PRN
  Administered 2022-12-24: 200 mg via INTRAVENOUS

## 2022-12-24 MED ORDER — MAGNESIUM OXIDE -MG SUPPLEMENT 400 (240 MG) MG PO TABS: 200.0000 mg | ORAL_TABLET | Freq: Every evening | ORAL | Status: DC

## 2022-12-24 MED ORDER — BUPIVACAINE LIPOSOME 1.3 % IJ SUSP
INTRAMUSCULAR | Status: DC | PRN
  Administered 2022-12-24: 20 mL

## 2022-12-24 MED ORDER — LIDOCAINE-EPINEPHRINE 1 %-1:100000 IJ SOLN
INTRAMUSCULAR | Status: AC
  Filled 2022-12-24: qty 1

## 2022-12-24 MED ORDER — CHLORHEXIDINE GLUCONATE CLOTH 2 % EX PADS: 6.0000 | MEDICATED_PAD | Freq: Once | CUTANEOUS | Status: DC

## 2022-12-24 MED ORDER — PHENOL 1.4 % MT LIQD: 1.0000 | OROMUCOSAL | Status: DC | PRN

## 2022-12-24 MED ORDER — PANTOPRAZOLE SODIUM 40 MG PO TBEC: 40.0000 mg | DELAYED_RELEASE_TABLET | Freq: Every day | ORAL | Status: DC

## 2022-12-24 MED ORDER — PROPOFOL 10 MG/ML IV BOLUS
INTRAVENOUS | Status: AC
  Filled 2022-12-24: qty 20

## 2022-12-24 MED ORDER — PANTOPRAZOLE SODIUM 40 MG IV SOLR: 40.0000 mg | Freq: Every day | INTRAVENOUS | Status: DC

## 2022-12-24 MED ORDER — ONDANSETRON HCL 4 MG/2ML IJ SOLN
INTRAMUSCULAR | Status: DC | PRN
  Administered 2022-12-24: 4 mg via INTRAVENOUS

## 2022-12-24 MED ORDER — EPHEDRINE 5 MG/ML INJ
INTRAVENOUS | Status: AC
  Filled 2022-12-24: qty 5

## 2022-12-24 MED ORDER — FENTANYL CITRATE (PF) 250 MCG/5ML IJ SOLN
INTRAMUSCULAR | Status: AC
  Filled 2022-12-24: qty 5

## 2022-12-24 MED ORDER — DICLOFENAC SODIUM 3 % EX GEL: 1.0000 "application " | Freq: Every day | CUTANEOUS | Status: DC

## 2022-12-24 MED ORDER — FENTANYL CITRATE (PF) 100 MCG/2ML IJ SOLN: 25.0000 ug | INTRAMUSCULAR | Status: DC | PRN

## 2022-12-24 MED ORDER — ONDANSETRON HCL 4 MG PO TABS: 4.0000 mg | ORAL_TABLET | Freq: Four times a day (QID) | ORAL | Status: DC | PRN

## 2022-12-24 MED ORDER — OXYCODONE HCL 5 MG/5ML PO SOLN: 5.0000 mg | Freq: Once | ORAL | Status: AC | PRN

## 2022-12-24 MED ORDER — OXYCODONE HCL 5 MG PO TABS
ORAL_TABLET | ORAL | Status: AC
  Filled 2022-12-24: qty 1

## 2022-12-24 MED ORDER — THROMBIN 20000 UNITS EX SOLR: CUTANEOUS | Status: DC | PRN

## 2022-12-24 MED ORDER — CEFAZOLIN SODIUM-DEXTROSE 2-4 GM/100ML-% IV SOLN: 2.0000 g | Freq: Three times a day (TID) | INTRAVENOUS | Status: DC

## 2022-12-24 MED ORDER — PHENYLEPHRINE HCL-NACL 20-0.9 MG/250ML-% IV SOLN
INTRAVENOUS | Status: DC | PRN
  Administered 2022-12-24: 40 ug/min via INTRAVENOUS

## 2022-12-24 MED ORDER — ONDANSETRON HCL 4 MG/2ML IJ SOLN: 4.0000 mg | Freq: Four times a day (QID) | INTRAMUSCULAR | Status: DC | PRN

## 2022-12-24 MED ORDER — ALUM & MAG HYDROXIDE-SIMETH 200-200-20 MG/5ML PO SUSP: 30.0000 mL | Freq: Four times a day (QID) | ORAL | Status: DC | PRN

## 2022-12-24 MED ORDER — VITAMIN C 500 MG PO TABS: 1000.0000 mg | ORAL_TABLET | Freq: Every day | ORAL | Status: DC

## 2022-12-24 MED ORDER — FENTANYL CITRATE (PF) 250 MCG/5ML IJ SOLN
INTRAMUSCULAR | Status: DC | PRN
  Administered 2022-12-24: 100 ug via INTRAVENOUS

## 2022-12-24 MED ORDER — ROCURONIUM BROMIDE 10 MG/ML (PF) SYRINGE
PREFILLED_SYRINGE | INTRAVENOUS | Status: AC
  Filled 2022-12-24: qty 10

## 2022-12-24 MED ORDER — ATORVASTATIN CALCIUM 40 MG PO TABS: 40.0000 mg | ORAL_TABLET | Freq: Every day | ORAL | Status: DC

## 2022-12-24 MED ORDER — ACETAMINOPHEN 500 MG PO TABS
1000.0000 mg | ORAL_TABLET | Freq: Once | ORAL | Status: AC
  Administered 2022-12-24: 1000 mg via ORAL
  Filled 2022-12-24: qty 2

## 2022-12-24 MED ORDER — ONDANSETRON HCL 4 MG/2ML IJ SOLN
INTRAMUSCULAR | Status: AC
  Filled 2022-12-24: qty 2

## 2022-12-24 MED ORDER — DICLOFENAC SODIUM 1 % EX GEL
2.0000 g | Freq: Every day | CUTANEOUS | Status: DC
  Filled 2022-12-24: qty 100

## 2022-12-24 MED ORDER — DEXAMETHASONE SODIUM PHOSPHATE 10 MG/ML IJ SOLN
INTRAMUSCULAR | Status: DC | PRN
  Administered 2022-12-24: 10 mg via INTRAVENOUS

## 2022-12-24 MED ORDER — HYDROMORPHONE HCL 1 MG/ML IJ SOLN: 0.5000 mg | INTRAMUSCULAR | Status: DC | PRN

## 2022-12-24 MED ORDER — LIDOCAINE 2% (20 MG/ML) 5 ML SYRINGE
INTRAMUSCULAR | Status: AC
  Filled 2022-12-24: qty 5

## 2022-12-24 MED ORDER — BUPIVACAINE HCL (PF) 0.25 % IJ SOLN: INTRAMUSCULAR | Status: DC | PRN

## 2022-12-24 MED ORDER — LIDOCAINE-EPINEPHRINE 1 %-1:100000 IJ SOLN
INTRAMUSCULAR | Status: DC | PRN
  Administered 2022-12-24: 10 mL

## 2022-12-24 MED ORDER — THROMBIN 20000 UNITS EX SOLR
CUTANEOUS | Status: AC
  Filled 2022-12-24: qty 20000

## 2022-12-24 MED ORDER — BUPIVACAINE LIPOSOME 1.3 % IJ SUSP
INTRAMUSCULAR | Status: AC
  Filled 2022-12-24: qty 20

## 2022-12-24 MED ORDER — PROPOFOL 10 MG/ML IV BOLUS
INTRAVENOUS | Status: DC | PRN
  Administered 2022-12-24: 200 mg via INTRAVENOUS

## 2022-12-24 MED ORDER — ORAL CARE MOUTH RINSE: 15.0000 mL | Freq: Once | OROMUCOSAL | Status: AC

## 2022-12-24 MED ORDER — MENTHOL 3 MG MT LOZG: 1.0000 | LOZENGE | OROMUCOSAL | Status: DC | PRN

## 2022-12-24 MED ORDER — CELECOXIB 200 MG PO CAPS
200.0000 mg | ORAL_CAPSULE | Freq: Once | ORAL | Status: AC
  Administered 2022-12-24: 200 mg via ORAL
  Filled 2022-12-24: qty 1

## 2022-12-24 MED ORDER — CHLORHEXIDINE GLUCONATE 0.12 % MT SOLN
15.0000 mL | Freq: Once | OROMUCOSAL | Status: AC
  Administered 2022-12-24: 15 mL via OROMUCOSAL
  Filled 2022-12-24: qty 15

## 2022-12-24 MED ORDER — HYDROCODONE-ACETAMINOPHEN 5-325 MG PO TABS: 1.0000 | ORAL_TABLET | ORAL | 0 refills | Status: AC | PRN

## 2022-12-24 MED ORDER — HYDROCODONE-ACETAMINOPHEN 5-325 MG PO TABS: 2.0000 | ORAL_TABLET | ORAL | Status: DC | PRN

## 2022-12-24 MED ORDER — CYCLOBENZAPRINE HCL 10 MG PO TABS: 10.0000 mg | ORAL_TABLET | Freq: Three times a day (TID) | ORAL | Status: DC | PRN

## 2022-12-24 MED ORDER — PSYLLIUM 58.12 % PO PACK: 1.0000 | PACK | Freq: Every day | ORAL | Status: DC

## 2022-12-24 MED ORDER — 0.9 % SODIUM CHLORIDE (POUR BTL) OPTIME
TOPICAL | Status: DC | PRN
  Administered 2022-12-24: 1000 mL

## 2022-12-24 MED ORDER — SODIUM CHLORIDE 0.9% FLUSH: 3.0000 mL | INTRAVENOUS | Status: DC | PRN

## 2022-12-24 MED ORDER — SODIUM CHLORIDE 0.9% FLUSH: 3.0000 mL | Freq: Two times a day (BID) | INTRAVENOUS | Status: DC

## 2022-12-24 SURGICAL SUPPLY — 57 items
BAG COUNTER SPONGE SURGICOUNT (BAG) ×1 IMPLANT
BENZOIN TINCTURE PRP APPL 2/3 (GAUZE/BANDAGES/DRESSINGS) ×1 IMPLANT
BLADE CLIPPER SURG (BLADE) IMPLANT
BLADE SURG 11 STRL SS (BLADE) ×1 IMPLANT
BONE FIBERS PLIAFX 5ML (Bone Implant) ×1 IMPLANT
BUR MATCHSTICK NEURO 3.0 LAGG (BURR) ×1 IMPLANT
BUR PRECISION FLUTE 6.0 (BURR) ×1 IMPLANT
CANISTER SUCT 3000ML PPV (MISCELLANEOUS) ×1 IMPLANT
CNTNR URN SCR LID CUP LEK RST (MISCELLANEOUS) ×1 IMPLANT
CONT SPEC 4OZ STRL OR WHT (MISCELLANEOUS) ×1
COVER BACK TABLE 24X17X13 BIG (DRAPES) IMPLANT
COVER BACK TABLE 60X90IN (DRAPES) ×1 IMPLANT
DERMABOND ADVANCED .7 DNX12 (GAUZE/BANDAGES/DRESSINGS) ×1 IMPLANT
DRAPE C-ARM 42X72 X-RAY (DRAPES) ×2 IMPLANT
DRAPE HALF SHEET 40X57 (DRAPES) IMPLANT
DRAPE LAPAROTOMY 100X72X124 (DRAPES) ×1 IMPLANT
DRAPE POUCH INSTRU U-SHP 10X18 (DRAPES) ×1 IMPLANT
DRAPE SURG 17X23 STRL (DRAPES) ×1 IMPLANT
DRSG OPSITE POSTOP 4X6 (GAUZE/BANDAGES/DRESSINGS) IMPLANT
DURAPREP 26ML APPLICATOR (WOUND CARE) ×1 IMPLANT
ELECT REM PT RETURN 9FT ADLT (ELECTROSURGICAL) ×1
ELECTRODE REM PT RTRN 9FT ADLT (ELECTROSURGICAL) ×1 IMPLANT
EVACUATOR 3/16  PVC DRAIN (DRAIN) ×1
EVACUATOR 3/16 PVC DRAIN (DRAIN) ×1 IMPLANT
GAUZE 4X4 16PLY ~~LOC~~+RFID DBL (SPONGE) IMPLANT
GAUZE SPONGE 4X4 12PLY STRL (GAUZE/BANDAGES/DRESSINGS) ×1 IMPLANT
GLOVE BIO SURGEON STRL SZ7 (GLOVE) IMPLANT
GLOVE BIO SURGEON STRL SZ8 (GLOVE) ×2 IMPLANT
GLOVE BIOGEL PI IND STRL 7.0 (GLOVE) IMPLANT
GLOVE EXAM NITRILE XL STR (GLOVE) IMPLANT
GLOVE INDICATOR 8.5 STRL (GLOVE) ×4 IMPLANT
GOWN STRL REUS W/ TWL LRG LVL3 (GOWN DISPOSABLE) IMPLANT
GOWN STRL REUS W/ TWL XL LVL3 (GOWN DISPOSABLE) ×2 IMPLANT
GOWN STRL REUS W/TWL 2XL LVL3 (GOWN DISPOSABLE) IMPLANT
GOWN STRL REUS W/TWL LRG LVL3 (GOWN DISPOSABLE)
GOWN STRL REUS W/TWL XL LVL3 (GOWN DISPOSABLE) ×2
GRAFT BNE FBR PLIAFX PRIME 5 (Bone Implant) IMPLANT
KIT BASIN OR (CUSTOM PROCEDURE TRAY) ×1 IMPLANT
KIT INFUSE X SMALL 1.4CC (Orthopedic Implant) IMPLANT
KIT TURNOVER KIT B (KITS) ×1 IMPLANT
NDL HYPO 25X1 1.5 SAFETY (NEEDLE) ×1 IMPLANT
NEEDLE HYPO 25X1 1.5 SAFETY (NEEDLE) ×1 IMPLANT
NS IRRIG 1000ML POUR BTL (IV SOLUTION) ×1 IMPLANT
PACK LAMINECTOMY NEURO (CUSTOM PROCEDURE TRAY) ×1 IMPLANT
PAD ARMBOARD 7.5X6 YLW CONV (MISCELLANEOUS) ×3 IMPLANT
SPIKE FLUID TRANSFER (MISCELLANEOUS) ×1 IMPLANT
SPONGE SURGIFOAM ABS GEL 100 (HEMOSTASIS) ×1 IMPLANT
SPONGE T-LAP 4X18 ~~LOC~~+RFID (SPONGE) IMPLANT
STRIP CLOSURE SKIN 1/2X4 (GAUZE/BANDAGES/DRESSINGS) ×2 IMPLANT
SUT VIC AB 0 CT1 18XCR BRD8 (SUTURE) ×2 IMPLANT
SUT VIC AB 0 CT1 8-18 (SUTURE) ×2
SUT VIC AB 2-0 CT1 18 (SUTURE) ×1 IMPLANT
SUT VICRYL 4-0 PS2 18IN ABS (SUTURE) ×1 IMPLANT
TOWEL GREEN STERILE (TOWEL DISPOSABLE) ×1 IMPLANT
TOWEL GREEN STERILE FF (TOWEL DISPOSABLE) ×1 IMPLANT
TRAY FOLEY MTR SLVR 16FR STAT (SET/KITS/TRAYS/PACK) ×1 IMPLANT
WATER STERILE IRR 1000ML POUR (IV SOLUTION) ×1 IMPLANT

## 2022-12-24 NOTE — Transfer of Care (Signed)
Immediate Anesthesia Transfer of Care Note  Patient: Adrian Byrd  Procedure(s) Performed: Exploration removal of left-sided cortical screws Lumbar Two-Lumbar Three with redo posterolateral arthrodesis on that side - Lumbar Two-Lumbar Three (Left: Back)  Patient Location: PACU  Anesthesia Type:General  Level of Consciousness: awake, alert , and oriented  Airway & Oxygen Therapy: Patient connected to face mask oxygen  Post-op Assessment: Report given to RN and Post -op Vital signs reviewed and stable  Post vital signs: Reviewed and stable  Last Vitals:  Vitals Value Taken Time  BP 148/69 12/24/22 0848  Temp    Pulse 82 12/24/22 0851  Resp 18 12/24/22 0851  SpO2 94 % 12/24/22 0851  Vitals shown include unvalidated device data.  Last Pain:  Vitals:   12/24/22 0623  PainSc: 4          Complications: There were no known notable events for this encounter.

## 2022-12-24 NOTE — Anesthesia Procedure Notes (Signed)
Procedure Name: Intubation Date/Time: 12/24/2022 7:43 AM  Performed by: Ester Rink, CRNAPre-anesthesia Checklist: Patient identified, Emergency Drugs available, Suction available and Patient being monitored Patient Re-evaluated:Patient Re-evaluated prior to induction Oxygen Delivery Method: Circle system utilized Preoxygenation: Pre-oxygenation with 100% oxygen Induction Type: IV induction Ventilation: Mask ventilation without difficulty Laryngoscope Size: Glidescope and 4 Grade View: Grade I Tube type: Oral Tube size: 7.5 mm Number of attempts: 1 Airway Equipment and Method: Stylet and Oral airway Placement Confirmation: ETT inserted through vocal cords under direct vision, positive ETCO2 and breath sounds checked- equal and bilateral Secured at: 23 cm Tube secured with: Tape Dental Injury: Teeth and Oropharynx as per pre-operative assessment

## 2022-12-24 NOTE — Progress Notes (Signed)
Patient alert and oriented, mae's well, voiding adequate amount of urine, swallowing without difficulty, no c/o pain at time of discharge. Patient discharged home with family. Script and discharged instructions given to patient. Patient and family stated understanding of instructions given. Patient has an appointment with Dr. Cram in 2 weeks 

## 2022-12-24 NOTE — Anesthesia Postprocedure Evaluation (Signed)
Anesthesia Post Note  Patient: Adrian Byrd  Procedure(s) Performed: Exploration removal of left-sided cortical screws Lumbar Two-Lumbar Three with redo posterolateral arthrodesis on that side - Lumbar Two-Lumbar Three (Left: Back)     Patient location during evaluation: PACU Anesthesia Type: General Level of consciousness: awake and alert Pain management: pain level controlled Vital Signs Assessment: post-procedure vital signs reviewed and stable Respiratory status: spontaneous breathing, nonlabored ventilation, respiratory function stable and patient connected to nasal cannula oxygen Cardiovascular status: blood pressure returned to baseline and stable Postop Assessment: no apparent nausea or vomiting Anesthetic complications: no   There were no known notable events for this encounter.  Last Vitals:  Vitals:   12/24/22 0930 12/24/22 0958  BP: 127/60 135/76  Pulse: 75 72  Resp: 12 18  Temp: 36.9 C 36.9 C  SpO2: (!) 88% 94%    Last Pain:  Vitals:   12/24/22 1005  PainSc: 3                  Axell Trigueros P Burel Kahre

## 2022-12-24 NOTE — Discharge Instructions (Signed)
.  Wound Care  Keep the incision clean and dry remove the outer dressing in 3 days, leave the Steri-Strips intact. Wrap with Saran wrap for showers only Do not put any creams, lotions, or ointments on incision. Leave steri-strips on back.  They will fall off by themselves.  Activity Walk each and every day, increasing distance each day. No lifting greater than 5 lbs.  No lifting no bending no twisting no driving or riding a car unless coming back and forth to see me.  Diet Resume your normal diet.     Call Your Doctor If Any of These Occur Redness, drainage, or swelling at the wound.  Temperature greater than 101 degrees. Severe pain not relieved by pain medication. Incision starts to come apart. Follow Up Appt Call [phone])  for problems.  If you have any hardware placed in your spine, you will need an x-ray before your appointment.

## 2022-12-24 NOTE — Discharge Summary (Addendum)
  Physician Discharge Summary  Patient ID: Adrian Byrd MRN: [id number] DOB/AGE: [birth year] 72 y.o. Estimated body mass index is 32.55 kg/m as calculated from the following:   Height as of this encounter: 6' (1.829 m).   Weight as of this encounter: 108.9 kg.   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  Postoperative patient did very well with covering the floor on the floor was ambulating voiding spontaneously tolerating regular diet and stable for discharge home.  Patient will discharge schedule follow-up in 1 to 2 weeks.  Consults: Significant Diagnostic Studies: Treatments: L2-3 removal of hardware redo posterior lateral arthrodesis. Discharge Exam: Blood pressure 135/76, pulse 72, temperature 98.4 F (36.9 C), resp. rate 18, height 6' (1.829 m), weight 108.9 kg, SpO2 94 %. Trying 5-5 and clean dry and intact   Disposition: Home   Allergies as of 12/24/2022   No Known Allergies      Medication List     TAKE these medications    acetaminophen 500 MG tablet Commonly known as: TYLENOL Take 1,000 mg by mouth every 6 (six) hours as needed for moderate pain or headache.   atorvastatin 40 MG tablet Commonly known as: LIPITOR Take 40 mg by mouth at bedtime.   Diclofenac Sodium 3 % Gel Apply 1 application  topically daily. Using for heel pain   HYDROcodone-acetaminophen 5-325 MG tablet Commonly known as: NORCO/VICODIN Take 1 tablet by mouth every 4 (four) hours as needed for moderate pain.   ibuprofen 200 MG tablet Commonly known as: ADVIL Take 800 mg by mouth every 8 (eight) hours as needed for moderate pain.   MAG GLYCINATE PO Take  240 mg by mouth every evening.   methocarbamol 750 MG tablet Commonly known as: Robaxin-750 Take 1 tablet (750 mg total) by mouth 4 (four) times daily.   omeprazole 40 MG capsule Commonly known as: PRILOSEC Take 40 mg by mouth in the morning and at bedtime.   psyllium 0.52 g capsule Commonly known as: REGULOID Take 0.52 g by mouth in the morning and at bedtime.   trolamine salicylate 10 % cream Commonly known as: ASPERCREME Apply 1 application  topically as needed for muscle pain.   VITAMIN C PO Take 1 tablet by mouth daily.        Follow-up Information     Kary Kos, MD Follow up.   Specialty: Neurosurgery Why: As needed, If symptoms worsen Contact information: [address] [place] [address] [place] [address] [phone]                 Signed: Ocie Cornfield [facility] 12/24/2022, 1:03 PM

## 2022-12-24 NOTE — Op Note (Signed)
Preoperative diagnosis: Back pain painful hardware pseudoarthrosis L2-3    Postoperative diagnosis: Same.  Procedure: #1 exploration fusion move of hardware L2-3 on the left with removal of left L2-3 cortical screws.  2.  Redo posterior lateral arthrodesis L2-3 utilizing locally harvested autograft mixed with cortical fibers and BMP.  Surgeon: Dominica Severin Davidlee Jeanbaptiste.  EBL: Less than 100.  Anesthesia: General.  HPI: 72 year old gentleman with back pain previous undergone L2-3 fusion workup for his back pain revealed loosening of his left-sided L2-L3 screws possible partial fusion although incomplete.  Due to patient's progression of clinical syndrome imaging findings of a conservative treatment I recommended exploration fusion removal of the left-sided L2-3 screws evaluation of his fusion and redo posterior lateral arthrodesis at that level.  I extensively went over the risks and benefits of that operation with him as well as perioperative course expectations of outcome and alternatives of surgery and he understood and agreed to proceed forward.  Operative procedure: Patient was brought into the OR was induced under general anesthesia positioned prone the Wilson frame his back was prepped and draped in routine sterile fashion.  His superior aspect of his old incision was opened up after being infiltrated with 10 cc lidocaine with epi and the scar tissue was dissected free and I exposed the hardware at L2-3 there was an extensive mount of bony overgrowth around the L2 screw in the left consistent with possible pseudoarthrosis so this was all removed the hardware was removed I then identified the TPs and lateral facet complexes from L2 down to L3 and aggressively decorticated him placed the autograft mixed with cortical fibers and BMP from the TP of L2 along the facet joints at L2-3 down to the TP at L3.  The wound was then copiously irrigated meticulous hemostasis was maintained checked Exparel was injected in  the fascia and the muscle fascia reapproximated layers with active Vicryl skin was closed running 4 subcuticular Dermabond benzoin Steri-Strips and sterile dressing was applied patient recovery in stable condition.  At the end the case all needle counts and sponge counts were correct.
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
## 2022-12-28 ENCOUNTER — Encounter (HOSPITAL_COMMUNITY): Payer: Self-pay | Admitting: Neurosurgery

## 2023-10-20 ENCOUNTER — Other Ambulatory Visit: Payer: Self-pay | Admitting: Neurosurgery

## 2023-10-20 DIAGNOSIS — M25552 Pain in left hip: Secondary | ICD-10-CM

## 2023-10-26 ENCOUNTER — Inpatient Hospital Stay
Admission: RE | Admit: 2023-10-26 | Discharge: 2023-10-26 | Discharge status: Home / Self Care | Payer: Medicare Other | Source: Ambulatory Visit | Attending: Neurosurgery | Admitting: Neurosurgery

## 2023-10-26 DIAGNOSIS — M5440 Lumbago with sciatica, unspecified side: Secondary | ICD-10-CM

## 2023-11-07 ENCOUNTER — Ambulatory Visit
Admission: RE | Admit: 2023-11-07 | Discharge: 2023-11-07 | Disposition: A | Discharge status: Home / Self Care | Payer: Medicare Other | Source: Ambulatory Visit | Attending: Neurosurgery | Admitting: Neurosurgery

## 2023-11-07 DIAGNOSIS — M25552 Pain in left hip: Secondary | ICD-10-CM
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
## 2024-09-20 NOTE — Patient Instructions (Signed)
 SURGICAL WAITING ROOM VISITATION Patients having surgery or a procedure may have no more than 2 support people in the waiting area - these visitors may rotate in the visitor waiting room.   If the patient needs to stay at the hospital during part of their recovery, the visitor guidelines for inpatient rooms apply.  PRE-OP VISITATION  Pre-op nurse will coordinate an appropriate time for 1 support person to accompany the patient in pre-op.  This support person may not rotate.  This visitor will be contacted when the time is appropriate for the visitor to come back in the pre-op area.  Please refer to the [facility] website for the visitor guidelines for Inpatients (after your surgery is over and you are in a regular room).  You are not required to quarantine at this time prior to your surgery. However, you must do this: Hand Hygiene often Do NOT share personal items Notify your provider if you are in close contact with someone who has COVID or you develop fever 100.4 or greater, new onset of sneezing, cough, sore throat, shortness of breath or body aches.  If you test positive for Covid or have been in contact with anyone that has tested positive in the last 10 days please notify you surgeon.    Your procedure is scheduled on:  Monday  October 01, 2024  Report to [facility] Main Entrance: [address] entrance where the [employer] is available.   Report to admitting at: 11:15   AM  Call this number if you have any questions or problems the morning of surgery [phone]  Do not eat food after Midnight the night prior to your surgery/procedure.  After Midnight you may have the following liquids until  10:45 AM  DAY OF SURGERY  Clear Liquid Diet Water Black Coffee (sugar ok, NO MILK/CREAM OR CREAMERS)  Tea (sugar ok, NO MILK/CREAM OR CREAMERS) regular and decaf                             Plain Jell-O  with no fruit (NO RED)                                           Fruit ices  (not with fruit pulp, NO RED)                                     Popsicles (NO RED)                                                                  Juice: NO CITRUS JUICES: only apple, WHITE grape, WHITE cranberry Sports drinks like Gatorade or Powerade (NO RED)                   The day of surgery:  Drink ONE (1) Pre-Surgery G2 at   10:45  AM the morning of surgery. Drink in one sitting. Do not sip.  This drink was given to you during your hospital pre-op appointment visit. Nothing else to drink after  completing the Pre-Surgery  G2 : No candy, chewing gum or throat lozenges.    FOLLOW ANY ADDITIONAL PRE OP INSTRUCTIONS YOU RECEIVED FROM YOUR SURGEON'S OFFICE!!!   Oral Hygiene is also important to reduce your risk of infection.        Remember - BRUSH YOUR TEETH THE MORNING OF SURGERY WITH YOUR REGULAR TOOTHPASTE  Do NOT smoke after Midnight the night before surgery.  STOP TAKING all Vitamins, Herbs and supplements 1 week before your surgery.   Take ONLY these medicines the morning of surgery with A SIP OF WATER: omeprazole, and Tylenol  if needed for pain.                    You may not have any metal on your body including  jewelry, and body piercing  Do not wear  lotions, powders, cologne, or deodorant  Men may shave face and neck.  Contacts, Hearing Aids, dentures or bridgework may not be worn into surgery. DENTURES WILL BE REMOVED PRIOR TO SURGERY PLEASE DO NOT APPLY Poly grip OR ADHESIVES!!!  You may bring a small overnight bag with you on the day of surgery, only pack items that are not valuable. [facility] IS NOT RESPONSIBLE   FOR VALUABLES THAT ARE LOST OR STOLEN.   Do not bring your home medications to the hospital. The Pharmacy will dispense medications listed on your medication list to you during your admission in the Hospital.  Special Instructions: Bring a copy of your healthcare power of attorney and living will documents the day of surgery, if you wish to  have them scanned into your [facility] Records- EPIC  Please read over the following fact sheets you were given: IF YOU HAVE QUESTIONS ABOUT YOUR PRE-OP INSTRUCTIONS, PLEASE CALL [phone].      Pre-operative 4 CHG Bath Instructions   You can play a key role in reducing the risk of infection after surgery. Your skin needs to be as free of germs as possible. You can reduce the number of germs on your skin by washing with CHG (chlorhexidine  gluconate) soap before surgery. CHG is an antiseptic soap that kills germs and continues to kill germs even after washing.   DO NOT use if you have an allergy to chlorhexidine /CHG or antibacterial soaps. If your skin becomes reddened or irritated, stop using the CHG and notify one of our RNs at   Please shower with the CHG soap starting 4 days before surgery using the following schedule:  Thursday  September 27, 2024    Please keep in mind the following:  DO NOT shave, including legs and underarms, starting the day of your first shower.   You may shave your face at any point before/day of surgery.  Place clean sheets on your bed the day you start using CHG soap. Use a clean washcloth (not used since being washed) for each shower. DO NOT sleep with pets once you start using the CHG.  CHG Shower Instructions:  If you choose to wash your hair and private area, wash first with your normal shampoo/soap.  After you use shampoo/soap, rinse your hair and body thoroughly to remove shampoo/soap residue.  Turn the water OFF and apply about 3 tablespoons (45 ml) of CHG soap to a CLEAN washcloth.  Apply CHG soap ONLY FROM YOUR NECK DOWN TO YOUR TOES (washing for 3-5 minutes)  DO NOT use CHG soap on face, private areas, open wounds, or sores.  Pay special attention to the area  where your surgery is being performed.  If you are having back surgery, having someone wash your back for you may be helpful. Wait 2 minutes after CHG soap is applied, then you may  rinse off the CHG soap.  Pat dry with a clean towel  Put on clean clothes/pajamas   If you choose to wear lotion, please use ONLY the CHG-compatible lotions on the back of this paper.     Additional instructions for the day of surgery: DO NOT APPLY CHG Soap, any lotions, deodorants, cologne, or perfumes.   Put on clean/comfortable clothes.  Brush your teeth.  Ask your nurse before applying any prescription medications to the skin.   CHG Compatible Lotions   Aveeno Moisturizing lotion  Cetaphil Moisturizing Cream  Cetaphil Moisturizing Lotion  Clairol Herbal Essence Moisturizing Lotion, Dry Skin  Clairol Herbal Essence Moisturizing Lotion, Extra Dry Skin  Clairol Herbal Essence Moisturizing Lotion, Normal Skin  Curel Age Defying Therapeutic Moisturizing Lotion with Alpha Hydroxy  Curel Extreme Care Body Lotion  Curel Soothing Hands Moisturizing Hand Lotion  Curel Therapeutic Moisturizing Cream, Fragrance-Free  Curel Therapeutic Moisturizing Lotion, Fragrance-Free  Curel Therapeutic Moisturizing Lotion, Original Formula  Eucerin Daily Replenishing Lotion  Eucerin Dry Skin Therapy Plus Alpha Hydroxy Crme  Eucerin Dry Skin Therapy Plus Alpha Hydroxy Lotion  Eucerin Original Crme  Eucerin Original Lotion  Eucerin Plus Crme Eucerin Plus Lotion  Eucerin TriLipid Replenishing Lotion  Keri Anti-Bacterial Hand Lotion  Keri Deep Conditioning Original Lotion Dry Skin Formula Softly Scented  Keri Deep Conditioning Original Lotion, Fragrance Free Sensitive Skin Formula  Keri Lotion Fast Absorbing Fragrance Free Sensitive Skin Formula  Keri Lotion Fast Absorbing Softly Scented Dry Skin Formula  Keri Original Lotion  Keri Skin Renewal Lotion Keri Silky Smooth Lotion  Keri Silky Smooth Sensitive Skin Lotion  Nivea Body Creamy Conditioning Oil  Nivea Body Extra Enriched Lotion  Nivea Body Original Lotion  Nivea Body Sheer Moisturizing Lotion Nivea Crme  Nivea Skin Firming Lotion   NutraDerm 30 Skin Lotion  NutraDerm Skin Lotion  NutraDerm Therapeutic Skin Cream  NutraDerm Therapeutic Skin Lotion  ProShield Protective Hand Cream  Provon moisturizing lotion   FAILURE TO FOLLOW THESE INSTRUCTIONS MAY RESULT IN THE CANCELLATION OF YOUR SURGERY  PATIENT SIGNATURE_________________________________  NURSE SIGNATURE__________________________________  ________________________________________________________________________         Nasario Exon    An incentive spirometer is a tool that can help keep your lungs clear and active. This tool measures how well you are filling your lungs with each breath. Taking long deep breaths may help reverse or decrease the chance of developing breathing (pulmonary) problems (especially infection) following: A long period of time when you are unable to move or be active. BEFORE THE PROCEDURE  If the spirometer includes an indicator to show your best effort, your nurse or respiratory therapist will set it to a desired goal. If possible, sit up straight or lean slightly forward. Try not to slouch. Hold the incentive spirometer in an upright position. INSTRUCTIONS FOR USE  Sit on the edge of your bed if possible, or sit up as far as you can in bed or on a chair. Hold the incentive spirometer in an upright position. Breathe out normally. Place the mouthpiece in your mouth and seal your lips tightly around it. Breathe in slowly and as deeply as possible, raising the piston or the ball toward the top of the column. Hold your breath for 3-5 seconds or for as long as possible. Allow the  piston or ball to fall to the bottom of the column. Remove the mouthpiece from your mouth and breathe out normally. Rest for a few seconds and repeat Steps 1 through 7 at least 10 times every 1-2 hours when you are awake. Take your time and take a few normal breaths between deep breaths. The spirometer may include an indicator to show your best  effort. Use the indicator as a goal to work toward during each repetition. After each set of 10 deep breaths, practice coughing to be sure your lungs are clear. If you have an incision (the cut made at the time of surgery), support your incision when coughing by placing a pillow or rolled up towels firmly against it. Once you are able to get out of bed, walk around indoors and cough well. You may stop using the incentive spirometer when instructed by your caregiver.  RISKS AND COMPLICATIONS Take your time so you do not get dizzy or light-headed. If you are in pain, you may need to take or ask for pain medication before doing incentive spirometry. It is harder to take a deep breath if you are having pain. AFTER USE Rest and breathe slowly and easily. It can be helpful to keep track of a log of your progress. Your caregiver can provide you with a simple table to help with this. If you are using the spirometer at home, follow these instructions: SEEK MEDICAL CARE IF:  You are having difficultly using the spirometer. You have trouble using the spirometer as often as instructed. Your pain medication is not giving enough relief while using the spirometer. You develop fever of 100.5 F (38.1 C) or higher.                                                                                                    SEEK IMMEDIATE MEDICAL CARE IF:  You cough up bloody sputum that had not been present before. You develop fever of 102 F (38.9 C) or greater. You develop worsening pain at or near the incision site. MAKE SURE YOU:  Understand these instructions. Will watch your condition. Will get help right away if you are not doing well or get worse. Document Released: 04/11/2007 Document Revised: 02/21/2012 Document Reviewed: 06/12/2007 [facility] Patient Information 2014 [place], [place].          WHAT IS A BLOOD TRANSFUSION? Blood Transfusion Information  A transfusion is the replacement of blood or  some of its parts. Blood is made up of multiple cells which provide different functions. Red blood cells carry oxygen and are used for blood loss replacement. White blood cells fight against infection. Platelets control bleeding. Plasma helps clot blood. Other blood products are available for specialized needs, such as hemophilia or other clotting disorders. BEFORE THE TRANSFUSION  Who gives blood for transfusions?  Healthy volunteers who are fully evaluated to make sure their blood is safe. This is blood bank blood. Transfusion therapy is the safest it has ever been in the practice of medicine. Before blood is taken from a donor, a complete history is taken to  make sure that person has no history of diseases nor engages in risky social behavior (examples are intravenous drug use or sexual activity with multiple partners). The donor's travel history is screened to minimize risk of transmitting infections, such as malaria. The donated blood is tested for signs of infectious diseases, such as HIV and hepatitis. The blood is then tested to be sure it is compatible with you in order to minimize the chance of a transfusion reaction. If you or a relative donates blood, this is often done in anticipation of surgery and is not appropriate for emergency situations. It takes many days to process the donated blood. RISKS AND COMPLICATIONS Although transfusion therapy is very safe and saves many lives, the main dangers of transfusion include:  Getting an infectious disease. Developing a transfusion reaction. This is an allergic reaction to something in the blood you were given. Every precaution is taken to prevent this. The decision to have a blood transfusion has been considered carefully by your caregiver before blood is given. Blood is not given unless the benefits outweigh the risks. AFTER THE TRANSFUSION Right after receiving a blood transfusion, you will usually feel much better and more energetic. This is  especially true if your red blood cells have gotten low (anemic). The transfusion raises the level of the red blood cells which carry oxygen, and this usually causes an energy increase. The nurse administering the transfusion will monitor you carefully for complications. HOME CARE INSTRUCTIONS  No special instructions are needed after a transfusion. You may find your energy is better. Speak with your caregiver about any limitations on activity for underlying diseases you may have. SEEK MEDICAL CARE IF:  Your condition is not improving after your transfusion. You develop redness or irritation at the intravenous (IV) site. SEEK IMMEDIATE MEDICAL CARE IF:  Any of the following symptoms occur over the next 12 hours: Shaking chills. You have a temperature by mouth above 102 F (38.9 C), not controlled by medicine. Chest, back, or muscle pain. People around you feel you are not acting correctly or are confused. Shortness of breath or difficulty breathing. Dizziness and fainting. You get a rash or develop hives. You have a decrease in urine output. Your urine turns a dark color or changes to pink, red, or brown. Any of the following symptoms occur over the next 10 days: You have a temperature by mouth above 102 F (38.9 C), not controlled by medicine. Shortness of breath. Weakness after normal activity. The white part of the eye turns yellow (jaundice). You have a decrease in the amount of urine or are urinating less often. Your urine turns a dark color or changes to pink, red, or brown. Document Released: 11/26/2000 Document Revised: 02/21/2012 Document Reviewed: 07/15/2008 [facility] Patient Information 2014 [employer], [place].  _______________________________________________________________________            If you would like to see a video about joint replacement:   [email or web address]

## 2024-09-21 ENCOUNTER — Encounter (HOSPITAL_COMMUNITY)
Admission: RE | Admit: 2024-09-21 | Discharge: 2024-09-21 | Disposition: A | Discharge status: Home / Self Care | Source: Ambulatory Visit | Attending: Orthopedic Surgery | Admitting: Orthopedic Surgery

## 2024-09-21 DIAGNOSIS — M1612 Unilateral primary osteoarthritis, left hip: Secondary | ICD-10-CM

## 2024-09-21 DIAGNOSIS — Z01818 Encounter for other preprocedural examination: Secondary | ICD-10-CM

## 2024-09-21 DIAGNOSIS — R7303 Prediabetes: Secondary | ICD-10-CM
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
## 2024-11-28 NOTE — Progress Notes (Addendum)
 Anesthesia Review:  PCP: Manfredi LOV  LOV 07/11/24  Cardiologist :[facility] in [place] DR McCabe  Clearance dated 07/19/24 in Media Tab OV 07/02/24 in Care Everywhere   PPM/ ICD: Device Orders: Rep Notified:  Chest x-ray : EKG : 06/06/24 [facility]  Echo : 07/13/24 [facility]  reslts in Care Everywhere  Stress test: Cardiac Cath :   Activity level: can do a flight of stairs without difficutly  Sleep Study/ CPAP : none  Fasting Blood Sugar :      / Checks Blood Sugar -- times a day:      Hx of prediabetes per pt .  PT lost weight and no longer considered prediabetes per pt Hgba1c-12/10/24- 5.7 Blood Thinner/ Instructions /Last Dose: ASA / Instructions/ Last Dose :

## 2024-12-04 NOTE — Patient Instructions (Signed)
 SURGICAL WAITING ROOM VISITATION  Patients having surgery or a procedure may have no more than 2 support people in the waiting area - these visitors may rotate.    Children ages 47 and under will not be able to visit patients in [facility] under most circumstances.   Visitors with respiratory illnesses are discouraged from visiting and should remain at home.  If the patient needs to stay at the hospital during part of their recovery, the visitor guidelines for inpatient rooms apply. Pre-op nurse will coordinate an appropriate time for 1 support person to accompany patient in pre-op.  This support person may not rotate.    Please refer to the [facility] website for the visitor guidelines for Inpatients (after your surgery is over and you are in a regular room).       Your procedure is scheduled on:   12/19/2024    Report to [facility] Main Entrance    Report to admitting at  0600 AM   Call this number if you have problems the morning of surgery [phone]   Do not eat food :After Midnight.   After Midnight you may have the following liquids until _ 0530_____ AM  DAY OF SURGERY  Water Non-Citrus Juices (without pulp, NO RED-Apple, White grape, White cranberry) Black Coffee (NO MILK/CREAM OR CREAMERS, sugar ok)  Clear Tea (NO MILK/CREAM OR CREAMERS, sugar ok) regular and decaf                             Plain Jell-O (NO RED)                                           Fruit ices (not with fruit pulp, NO RED)                                     Popsicles (NO RED)                                                               Sports drinks like Gatorade (NO RED)                    The day of surgery:  Drink ONE (1) Pre-Surgery Clear Ensure or G2 at   0530AM the morning of surgery. Drink in one sitting. Do not sip.  This drink was given to you during your hospital  pre-op appointment visit. Nothing else to drink after completing the  Pre-Surgery Clear Ensure  or G2.          If you have questions, please contact your surgeons office.       Oral Hygiene is also important to reduce your risk of infection.                                    Remember - BRUSH YOUR TEETH THE MORNING OF SURGERY WITH YOUR REGULAR TOOTHPASTE  DENTURES WILL BE REMOVED PRIOR TO SURGERY PLEASE DO  NOT APPLY Poly grip OR ADHESIVES!!!   Do NOT smoke after Midnight   Stop all vitamins and herbal supplements 7 days before surgery.   Take these medicines the morning of surgery with A SIP OF WATER:  omeprazole   DO NOT TAKE ANY ORAL DIABETIC MEDICATIONS DAY OF YOUR SURGERY  Bring CPAP mask and tubing day of surgery.                              You may not have any metal on your body including hair pins, jewelry, and body piercing             Do not wear make-up, lotions, powders, perfumes/cologne, or deodorant  Do not wear nail polish including gel and S&S, artificial/acrylic nails, or any other type of covering on natural nails including finger and toenails. If you have artificial nails, gel coating, etc. that needs to be removed by a nail salon please have this removed prior to surgery or surgery may need to be canceled/ delayed if the surgeon/ anesthesia feels like they are unable to be safely monitored.   Do not shave  48 hours prior to surgery.               Men may shave face and neck.   Do not bring valuables to the hospital. [facility] IS NOT             RESPONSIBLE   FOR VALUABLES.   Contacts, glasses, dentures or bridgework may not be worn into surgery.   Bring small overnight bag day of surgery.   DO NOT BRING YOUR HOME MEDICATIONS TO THE HOSPITAL. PHARMACY WILL DISPENSE MEDICATIONS LISTED ON YOUR MEDICATION LIST TO YOU DURING YOUR ADMISSION IN THE HOSPITAL!    Patients discharged on the day of surgery will not be allowed to drive home.  Someone NEEDS to stay with you for the first 24 hours after anesthesia.   Special Instructions: Bring a copy of  your healthcare power of attorney and living will documents the day of surgery if you haven't scanned them before.              Please read over the following fact sheets you were given: IF YOU HAVE QUESTIONS ABOUT YOUR PRE-OP INSTRUCTIONS PLEASE CALL [phone].   If you received a COVID test during your pre-op visit  it is requested that you wear a mask when out in public, stay away from anyone that may not be feeling well and notify your surgeon if you develop symptoms. If you test positive for Covid or have been in contact with anyone that has tested positive in the last 10 days please notify you surgeon.      Pre-operative 4 CHG Bath Instructions   You can play a key role in reducing the risk of infection after surgery. Your skin needs to be as free of germs as possible. You can reduce the number of germs on your skin by washing with CHG (chlorhexidine  gluconate) soap before surgery. CHG is an antiseptic soap that kills germs and continues to kill germs even after washing.   DO NOT use if you have an allergy to chlorhexidine /CHG or antibacterial soaps. If your skin becomes reddened or irritated, stop using the CHG and notify one of our RNs at [phone].   Please shower with the CHG soap starting 4 days before surgery using the following schedule:     Please  keep in mind the following:  DO NOT shave, including legs and underarms, starting the day of your first shower.   You may shave your face at any point before/day of surgery.  Place clean sheets on your bed the day you start using CHG soap. Use a clean washcloth (not used since being washed) for each shower. DO NOT sleep with pets once you start using the CHG.   CHG Shower Instructions:  If you choose to wash your hair and private area, wash first with your normal shampoo/soap.  After you use shampoo/soap, rinse your hair and body thoroughly to remove shampoo/soap residue.  Turn the water OFF and apply about 3 tablespoons (45 ml)  of CHG soap to a CLEAN washcloth.  Apply CHG soap ONLY FROM YOUR NECK DOWN TO YOUR TOES (washing for 3-5 minutes)  DO NOT use CHG soap on face, private areas, open wounds, or sores.  Pay special attention to the area where your surgery is being performed.  If you are having back surgery, having someone wash your back for you may be helpful. Wait 2 minutes after CHG soap is applied, then you may rinse off the CHG soap.  Pat dry with a clean towel  Put on clean clothes/pajamas   If you choose to wear lotion, please use ONLY the CHG-compatible lotions on the back of this paper.     Additional instructions for the day of surgery: DO NOT APPLY any lotions, deodorants, cologne, or perfumes.   Put on clean/comfortable clothes.  Brush your teeth.  Ask your nurse before applying any prescription medications to the skin.      CHG Compatible Lotions   Aveeno Moisturizing lotion  Cetaphil Moisturizing Cream  Cetaphil Moisturizing Lotion  Clairol Herbal Essence Moisturizing Lotion, Dry Skin  Clairol Herbal Essence Moisturizing Lotion, Extra Dry Skin  Clairol Herbal Essence Moisturizing Lotion, Normal Skin  Curel Age Defying Therapeutic Moisturizing Lotion with Alpha Hydroxy  Curel Extreme Care Body Lotion  Curel Soothing Hands Moisturizing Hand Lotion  Curel Therapeutic Moisturizing Cream, Fragrance-Free  Curel Therapeutic Moisturizing Lotion, Fragrance-Free  Curel Therapeutic Moisturizing Lotion, Original Formula  Eucerin Daily Replenishing Lotion  Eucerin Dry Skin Therapy Plus Alpha Hydroxy Crme  Eucerin Dry Skin Therapy Plus Alpha Hydroxy Lotion  Eucerin Original Crme  Eucerin Original Lotion  Eucerin Plus Crme Eucerin Plus Lotion  Eucerin TriLipid Replenishing Lotion  Keri Anti-Bacterial Hand Lotion  Keri Deep Conditioning Original Lotion Dry Skin Formula Softly Scented  Keri Deep Conditioning Original Lotion, Fragrance Free Sensitive Skin Formula  Keri Lotion Fast Absorbing  Fragrance Free Sensitive Skin Formula  Keri Lotion Fast Absorbing Softly Scented Dry Skin Formula  Keri Original Lotion  Keri Skin Renewal Lotion Keri Silky Smooth Lotion  Keri Silky Smooth Sensitive Skin Lotion  Nivea Body Creamy Conditioning Oil  Nivea Body Extra Enriched Teacher, Adult Education Moisturizing Lotion Nivea Crme  Nivea Skin Firming Lotion  NutraDerm 30 Skin Lotion  NutraDerm Skin Lotion  NutraDerm Therapeutic Skin Cream  NutraDerm Therapeutic Skin Lotion  ProShield Protective Hand Cream  Provon moisturizing lotion

## 2024-12-10 ENCOUNTER — Other Ambulatory Visit: Payer: Self-pay

## 2024-12-10 ENCOUNTER — Encounter (HOSPITAL_COMMUNITY)
Admission: RE | Admit: 2024-12-10 | Discharge: 2024-12-10 | Disposition: A | Discharge status: Home / Self Care | Source: Ambulatory Visit | Attending: Orthopedic Surgery | Admitting: Orthopedic Surgery

## 2024-12-10 ENCOUNTER — Encounter (HOSPITAL_COMMUNITY): Payer: Self-pay

## 2024-12-10 VITALS — BP 122/65 | HR 72 | Temp 97.9°F | Resp 16 | Ht 71.0 in | Wt 215.0 lb

## 2024-12-10 DIAGNOSIS — Z87891 Personal history of nicotine dependence: Secondary | ICD-10-CM | POA: Diagnosis not present

## 2024-12-10 DIAGNOSIS — Z01812 Encounter for preprocedural laboratory examination: Secondary | ICD-10-CM | POA: Diagnosis present

## 2024-12-10 DIAGNOSIS — I10 Essential (primary) hypertension: Secondary | ICD-10-CM | POA: Insufficient documentation

## 2024-12-10 DIAGNOSIS — R7303 Prediabetes: Secondary | ICD-10-CM | POA: Diagnosis not present

## 2024-12-10 DIAGNOSIS — K219 Gastro-esophageal reflux disease without esophagitis: Secondary | ICD-10-CM | POA: Diagnosis not present

## 2024-12-10 DIAGNOSIS — M1612 Unilateral primary osteoarthritis, left hip: Secondary | ICD-10-CM | POA: Insufficient documentation

## 2024-12-10 DIAGNOSIS — G629 Polyneuropathy, unspecified: Secondary | ICD-10-CM | POA: Insufficient documentation

## 2024-12-10 DIAGNOSIS — Z01818 Encounter for other preprocedural examination: Secondary | ICD-10-CM

## 2024-12-10 HISTORY — DX: Unspecified osteoarthritis, unspecified site: M19.90

## 2024-12-10 LAB — SURGICAL PCR SCREEN
MRSA, PCR: NEGATIVE
Staphylococcus aureus: NEGATIVE

## 2024-12-10 LAB — CBC
HCT: 47 % (ref 39.0–52.0)
Hemoglobin: 14.9 g/dL (ref 13.0–17.0)
MCH: 30.5 pg (ref 26.0–34.0)
MCHC: 31.7 g/dL (ref 30.0–36.0)
MCV: 96.1 fL (ref 80.0–100.0)
Platelets: 276 K/uL (ref 150–400)
RBC: 4.89 MIL/uL (ref 4.22–5.81)
RDW: 13.1 % (ref 11.5–15.5)
WBC: 10.9 K/uL — ABNORMAL HIGH (ref 4.0–10.5)
nRBC: 0 % (ref 0.0–0.2)

## 2024-12-10 LAB — BASIC METABOLIC PANEL WITH GFR
Anion gap: 9 (ref 5–15)
BUN: 13 mg/dL (ref 8–23)
CO2: 27 mmol/L (ref 22–32)
Calcium: 10 mg/dL (ref 8.9–10.3)
Chloride: 108 mmol/L (ref 98–111)
Creatinine, Ser: 0.96 mg/dL (ref 0.61–1.24)
GFR, Estimated: >60 mL/min
Glucose, Bld: 93 mg/dL (ref 70–99)
Potassium: 4.8 mmol/L (ref 3.5–5.1)
Sodium: 143 mmol/L (ref 135–145)

## 2024-12-10 LAB — HEMOGLOBIN A1C
Hgb A1c MFr Bld: 5.7 % — ABNORMAL HIGH (ref 4.8–5.6)
Mean Plasma Glucose: 116.89 mg/dL

## 2024-12-11 ENCOUNTER — Encounter (HOSPITAL_COMMUNITY): Payer: Self-pay
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
## 2024-12-11 NOTE — Anesthesia Preprocedure Evaluation (Addendum)
 "                                  Anesthesia Evaluation  Patient identified by MRN, date of birth, ID band Patient awake    Reviewed: Allergy & Precautions, H&P , NPO status , Patient's Chart, lab work & pertinent test results  Airway Mallampati: II  TM Distance: >3 FB Neck ROM: Limited    Dental no notable dental hx. (+) Upper Dentures, Lower Dentures,    Pulmonary former smoker   Pulmonary exam normal breath sounds clear to auscultation       Cardiovascular negative cardio ROS Normal cardiovascular exam Rhythm:Regular Rate:Normal     Neuro/Psych  Neuromuscular disease negative neurological ROS  negative psych ROS   GI/Hepatic negative GI ROS, Neg liver ROS,GERD  Medicated,,  Endo/Other  negative endocrine ROS    Renal/GU negative Renal ROS  negative genitourinary   Musculoskeletal negative musculoskeletal ROS (+) Arthritis , Osteoarthritis,    Abdominal Normal abdominal exam  (+)   Peds negative pediatric ROS (+)  Hematology negative hematology ROS (+)   Anesthesia Other Findings   Reproductive/Obstetrics negative OB ROS                              Anesthesia Physical Anesthesia Plan  ASA: 3  Anesthesia Plan: General   Post-op Pain Management: Celebrex  PO (pre-op)* and Tylenol  PO (pre-op)*   Induction: Intravenous  PONV Risk Score and Plan: 2 and Ondansetron , Dexamethasone  and Treatment may vary due to age or medical condition  Airway Management Planned: LMA  Additional Equipment: None  Intra-op Plan:   Post-operative Plan: Extubation in OR  Informed Consent: I have reviewed the patients History and Physical, chart, labs and discussed the procedure including the risks, benefits and alternatives for the proposed anesthesia with the patient or authorized representative who has indicated his/her understanding and acceptance.     Dental advisory given  Plan Discussed with: CRNA and  Anesthesiologist  Anesthesia Plan Comments: (See PAT note from 12/29 DISCUSSION: Adrian Byrd is a 73 yo male with PMH of former smoking, HTN, GERD, neuropathy, arthritis, hx of ACDF, hx of multiple back surgeries and lumbar fusion.   Patient evaluated by Cardiology at [facility] on 07/11/24 due to abnormal EKG which apparently indicated septal infarct (no EKG to review in [facility] E[facility]). Patient denied any symptoms or hx of heart attack symptoms. An echo and stress test were ordered. Echo came back normal. Stress test not completed. Cardiac clearance was signed that patient is cleared as low risk (Scanned in media on 9/24) with following notes:   Seen for one time visit 07/11/24. No cardiac symptoms. No cardiac history. No history of CAD, EKG NSR. Echocardiogram normal heart function, EF 55-60%, normal valve function)   At PAT visit patient reports he can do stairs without CP/SOB. Reviewed with Dr. Keneth. Ok to proceed withour stress testing.   EKG 06/06/24 ([facility] - report only):   Impression EKG: no ST elevation, rate 66, normal sinus rhythm, Q waves in I and AVL.   Echo 07/13/24 ([facility]):   Impression Left Ventricle: Systolic function is normal. EF: 55-60%.   Left Ventricle: Wall motion is normal.   Aortic Valve: The aortic valve is tricuspid. The leaflets exhibit normal excursion.   Aortic Valve: There is no regurgitation.   Mitral Valve: There is trace regurgitation.  Tricuspid Valve: There is trace regurgitation.   Tricuspid Valve: The right ventricular systolic pressure is normal (<36 mmHg).  )        Anesthesia Quick Evaluation  "

## 2024-12-19 ENCOUNTER — Encounter (HOSPITAL_COMMUNITY): Payer: Self-pay | Admitting: Orthopedic Surgery

## 2024-12-19 ENCOUNTER — Ambulatory Visit (HOSPITAL_COMMUNITY): Payer: Self-pay | Admitting: Anesthesiology

## 2024-12-19 ENCOUNTER — Observation Stay (HOSPITAL_COMMUNITY)

## 2024-12-19 ENCOUNTER — Ambulatory Visit (HOSPITAL_COMMUNITY): Payer: Self-pay | Admitting: Physician Assistant

## 2024-12-19 ENCOUNTER — Encounter (HOSPITAL_COMMUNITY)
Admission: RE | Disposition: A | Discharge status: Home / Self Care | Payer: Self-pay | Source: Home / Self Care | Attending: Orthopedic Surgery

## 2024-12-19 ENCOUNTER — Other Ambulatory Visit: Payer: Self-pay

## 2024-12-19 ENCOUNTER — Observation Stay (HOSPITAL_COMMUNITY)
Admission: RE | Admit: 2024-12-19 | Discharge: 2024-12-20 | Disposition: A | Discharge status: Home / Self Care | Attending: Orthopedic Surgery | Admitting: Orthopedic Surgery

## 2024-12-19 ENCOUNTER — Ambulatory Visit (HOSPITAL_COMMUNITY)

## 2024-12-19 DIAGNOSIS — M1612 Unilateral primary osteoarthritis, left hip: Principal | ICD-10-CM | POA: Diagnosis present

## 2024-12-19 DIAGNOSIS — M25552 Pain in left hip: Secondary | ICD-10-CM | POA: Diagnosis present

## 2024-12-19 DIAGNOSIS — Z01818 Encounter for other preprocedural examination: Secondary | ICD-10-CM

## 2024-12-19 DIAGNOSIS — Z87891 Personal history of nicotine dependence: Secondary | ICD-10-CM | POA: Diagnosis not present

## 2024-12-19 DIAGNOSIS — T8484XA Pain due to internal orthopedic prosthetic devices, implants and grafts, initial encounter: Principal | ICD-10-CM

## 2024-12-19 DIAGNOSIS — E119 Type 2 diabetes mellitus without complications: Secondary | ICD-10-CM

## 2024-12-19 DIAGNOSIS — M169 Osteoarthritis of hip, unspecified: Secondary | ICD-10-CM | POA: Diagnosis present

## 2024-12-19 HISTORY — PX: TOTAL HIP ARTHROPLASTY: SHX124

## 2024-12-19 LAB — TYPE AND SCREEN
ABO/RH(D): B POS
Antibody Screen: NEGATIVE

## 2024-12-19 MED ORDER — ACETAMINOPHEN 500 MG PO TABS: 1000.0000 mg | ORAL_TABLET | Freq: Once | ORAL | Status: DC

## 2024-12-19 MED ORDER — ROCURONIUM BROMIDE 10 MG/ML (PF) SYRINGE
PREFILLED_SYRINGE | INTRAVENOUS | Status: AC
  Filled 2024-12-19: qty 10

## 2024-12-19 MED ORDER — POLYETHYLENE GLYCOL 3350 17 G PO PACK: 17.0000 g | PACK | Freq: Every day | ORAL | Status: DC | PRN

## 2024-12-19 MED ORDER — DOCUSATE SODIUM 100 MG PO CAPS
100.0000 mg | ORAL_CAPSULE | Freq: Two times a day (BID) | ORAL | Status: DC
  Administered 2024-12-19 – 2024-12-20 (×2): 100 mg via ORAL
  Filled 2024-12-19 (×2): qty 1

## 2024-12-19 MED ORDER — ONDANSETRON HCL 4 MG/2ML IJ SOLN: 4.0000 mg | Freq: Four times a day (QID) | INTRAMUSCULAR | Status: DC | PRN

## 2024-12-19 MED ORDER — ACETAMINOPHEN 10 MG/ML IV SOLN
1000.0000 mg | Freq: Four times a day (QID) | INTRAVENOUS | Status: DC
  Administered 2024-12-19: 1000 mg via INTRAVENOUS
  Filled 2024-12-19: qty 100

## 2024-12-19 MED ORDER — FENTANYL CITRATE (PF) 50 MCG/ML IJ SOSY
PREFILLED_SYRINGE | INTRAMUSCULAR | Status: AC
  Filled 2024-12-19: qty 3

## 2024-12-19 MED ORDER — TRANEXAMIC ACID-NACL 1000-0.7 MG/100ML-% IV SOLN
1000.0000 mg | INTRAVENOUS | Status: AC
  Administered 2024-12-19: 1000 mg via INTRAVENOUS
  Filled 2024-12-19: qty 100

## 2024-12-19 MED ORDER — OXYCODONE HCL 5 MG PO TABS
5.0000 mg | ORAL_TABLET | Freq: Once | ORAL | Status: AC | PRN
  Administered 2024-12-19: 5 mg via ORAL

## 2024-12-19 MED ORDER — SODIUM CHLORIDE 0.9 % IV SOLN: INTRAVENOUS | Status: DC

## 2024-12-19 MED ORDER — ROCURONIUM BROMIDE 10 MG/ML (PF) SYRINGE
PREFILLED_SYRINGE | INTRAVENOUS | Status: DC | PRN
  Administered 2024-12-19: 60 mg via INTRAVENOUS

## 2024-12-19 MED ORDER — LIDOCAINE HCL (PF) 2 % IJ SOLN
INTRAMUSCULAR | Status: AC
  Filled 2024-12-19: qty 5

## 2024-12-19 MED ORDER — CHLORHEXIDINE GLUCONATE 0.12 % MT SOLN
15.0000 mL | Freq: Once | OROMUCOSAL | Status: AC
  Administered 2024-12-19: 15 mL via OROMUCOSAL

## 2024-12-19 MED ORDER — ONDANSETRON HCL 4 MG/2ML IJ SOLN
INTRAMUSCULAR | Status: AC
  Filled 2024-12-19: qty 2

## 2024-12-19 MED ORDER — SUGAMMADEX SODIUM 200 MG/2ML IV SOLN
INTRAVENOUS | Status: AC
  Filled 2024-12-19: qty 2

## 2024-12-19 MED ORDER — LACTATED RINGERS IV SOLN: INTRAVENOUS | Status: DC

## 2024-12-19 MED ORDER — EPHEDRINE SULFATE (PRESSORS) 25 MG/5ML IV SOSY
PREFILLED_SYRINGE | INTRAVENOUS | Status: DC | PRN
  Administered 2024-12-19 (×2): 10 mg via INTRAVENOUS

## 2024-12-19 MED ORDER — LIDOCAINE HCL (PF) 2 % IJ SOLN
INTRAMUSCULAR | Status: DC | PRN
  Administered 2024-12-19: 100 mg via INTRADERMAL

## 2024-12-19 MED ORDER — ACETAMINOPHEN 325 MG PO TABS: 325.0000 mg | ORAL_TABLET | Freq: Four times a day (QID) | ORAL | Status: DC | PRN

## 2024-12-19 MED ORDER — OXYCODONE HCL 5 MG/5ML PO SOLN: 5.0000 mg | Freq: Once | ORAL | Status: AC | PRN

## 2024-12-19 MED ORDER — CHLORHEXIDINE GLUCONATE 0.12 % MT SOLN: 15.0000 mL | Freq: Once | OROMUCOSAL | Status: DC

## 2024-12-19 MED ORDER — FENTANYL CITRATE (PF) 50 MCG/ML IJ SOSY
25.0000 ug | PREFILLED_SYRINGE | INTRAMUSCULAR | Status: DC | PRN
  Administered 2024-12-19 (×3): 50 ug via INTRAVENOUS

## 2024-12-19 MED ORDER — MAGNESIUM CITRATE PO SOLN: 1.0000 | Freq: Once | ORAL | Status: DC | PRN

## 2024-12-19 MED ORDER — ORAL CARE MOUTH RINSE: 15.0000 mL | Freq: Once | OROMUCOSAL | Status: AC

## 2024-12-19 MED ORDER — BISACODYL 10 MG RE SUPP: 10.0000 mg | Freq: Every day | RECTAL | Status: DC | PRN

## 2024-12-19 MED ORDER — FENTANYL CITRATE (PF) 100 MCG/2ML IJ SOLN
INTRAMUSCULAR | Status: DC | PRN
  Administered 2024-12-19: 100 ug via INTRAVENOUS
  Administered 2024-12-19 (×2): 50 ug via INTRAVENOUS

## 2024-12-19 MED ORDER — SUGAMMADEX SODIUM 200 MG/2ML IV SOLN
INTRAVENOUS | Status: DC | PRN
  Administered 2024-12-19: 200 mg via INTRAVENOUS

## 2024-12-19 MED ORDER — ONDANSETRON HCL 4 MG/2ML IJ SOLN
INTRAMUSCULAR | Status: DC | PRN
  Administered 2024-12-19: 4 mg via INTRAVENOUS

## 2024-12-19 MED ORDER — PHENOL 1.4 % MT LIQD: 1.0000 | OROMUCOSAL | Status: DC | PRN

## 2024-12-19 MED ORDER — ORAL CARE MOUTH RINSE: 15.0000 mL | Freq: Once | OROMUCOSAL | Status: DC

## 2024-12-19 MED ORDER — METHOCARBAMOL 1000 MG/10ML IJ SOLN: 500.0000 mg | Freq: Four times a day (QID) | INTRAMUSCULAR | Status: DC | PRN

## 2024-12-19 MED ORDER — CEFAZOLIN SODIUM-DEXTROSE 2-4 GM/100ML-% IV SOLN
2.0000 g | INTRAVENOUS | Status: AC
  Administered 2024-12-19: 2 g via INTRAVENOUS
  Filled 2024-12-19: qty 100

## 2024-12-19 MED ORDER — FENTANYL CITRATE (PF) 100 MCG/2ML IJ SOLN
INTRAMUSCULAR | Status: AC
  Filled 2024-12-19: qty 2

## 2024-12-19 MED ORDER — ONDANSETRON HCL 4 MG PO TABS: 4.0000 mg | ORAL_TABLET | Freq: Four times a day (QID) | ORAL | Status: DC | PRN

## 2024-12-19 MED ORDER — EPHEDRINE 5 MG/ML INJ
INTRAVENOUS | Status: AC
  Filled 2024-12-19: qty 5

## 2024-12-19 MED ORDER — METOCLOPRAMIDE HCL 5 MG PO TABS: 5.0000 mg | ORAL_TABLET | Freq: Three times a day (TID) | ORAL | Status: DC | PRN

## 2024-12-19 MED ORDER — BUPIVACAINE-EPINEPHRINE (PF) 0.25% -1:200000 IJ SOLN
INTRAMUSCULAR | Status: AC
  Filled 2024-12-19: qty 30

## 2024-12-19 MED ORDER — HYDROMORPHONE HCL 1 MG/ML IJ SOLN
INTRAMUSCULAR | Status: AC
  Filled 2024-12-19: qty 1

## 2024-12-19 MED ORDER — DEXAMETHASONE SOD PHOSPHATE PF 10 MG/ML IJ SOLN
10.0000 mg | Freq: Once | INTRAMUSCULAR | Status: AC
  Administered 2024-12-20: 10 mg via INTRAVENOUS
  Filled 2024-12-19: qty 1

## 2024-12-19 MED ORDER — ASPIRIN 81 MG PO CHEW
81.0000 mg | CHEWABLE_TABLET | Freq: Two times a day (BID) | ORAL | Status: DC
  Administered 2024-12-20: 81 mg via ORAL
  Filled 2024-12-19: qty 1

## 2024-12-19 MED ORDER — CEFAZOLIN SODIUM-DEXTROSE 2-4 GM/100ML-% IV SOLN
2.0000 g | Freq: Four times a day (QID) | INTRAVENOUS | Status: AC
  Administered 2024-12-19 (×2): 2 g via INTRAVENOUS
  Filled 2024-12-19 (×2): qty 100

## 2024-12-19 MED ORDER — TRAMADOL HCL 50 MG PO TABS: 50.0000 mg | ORAL_TABLET | Freq: Four times a day (QID) | ORAL | Status: DC | PRN

## 2024-12-19 MED ORDER — METOCLOPRAMIDE HCL 5 MG/ML IJ SOLN: 5.0000 mg | Freq: Three times a day (TID) | INTRAMUSCULAR | Status: DC | PRN

## 2024-12-19 MED ORDER — HYDROCODONE-ACETAMINOPHEN 5-325 MG PO TABS
1.0000 | ORAL_TABLET | ORAL | Status: DC | PRN
  Administered 2024-12-19: 1 via ORAL
  Administered 2024-12-20 (×2): 2 via ORAL
  Filled 2024-12-19: qty 2
  Filled 2024-12-19: qty 1
  Filled 2024-12-19 (×2): qty 2

## 2024-12-19 MED ORDER — METHOCARBAMOL 500 MG PO TABS
500.0000 mg | ORAL_TABLET | Freq: Four times a day (QID) | ORAL | Status: DC | PRN
  Administered 2024-12-19 – 2024-12-20 (×2): 500 mg via ORAL
  Filled 2024-12-19 (×2): qty 1

## 2024-12-19 MED ORDER — MORPHINE SULFATE (PF) 2 MG/ML IV SOLN: 0.5000 mg | INTRAVENOUS | Status: DC | PRN

## 2024-12-19 MED ORDER — PROPOFOL 10 MG/ML IV BOLUS
INTRAVENOUS | Status: AC
  Filled 2024-12-19: qty 20

## 2024-12-19 MED ORDER — 0.9 % SODIUM CHLORIDE (POUR BTL) OPTIME
TOPICAL | Status: DC | PRN
  Administered 2024-12-19: 1000 mL

## 2024-12-19 MED ORDER — ONDANSETRON HCL 4 MG/2ML IJ SOLN: 4.0000 mg | Freq: Once | INTRAMUSCULAR | Status: DC | PRN

## 2024-12-19 MED ORDER — MEPERIDINE HCL 25 MG/ML IJ SOLN: 6.2500 mg | INTRAMUSCULAR | Status: DC | PRN

## 2024-12-19 MED ORDER — PROPOFOL 10 MG/ML IV BOLUS
INTRAVENOUS | Status: DC | PRN
  Administered 2024-12-19: 150 mg via INTRAVENOUS

## 2024-12-19 MED ORDER — HYDROMORPHONE HCL 1 MG/ML IJ SOLN
0.2500 mg | INTRAMUSCULAR | Status: DC | PRN
  Administered 2024-12-19 (×2): 0.5 mg via INTRAVENOUS

## 2024-12-19 MED ORDER — ATORVASTATIN CALCIUM 20 MG PO TABS: 20.0000 mg | ORAL_TABLET | Freq: Every day | ORAL | Status: DC

## 2024-12-19 MED ORDER — DEXAMETHASONE SOD PHOSPHATE PF 10 MG/ML IJ SOLN
8.0000 mg | Freq: Once | INTRAMUSCULAR | Status: AC
  Administered 2024-12-19: 8 mg via INTRAVENOUS

## 2024-12-19 MED ORDER — DEXAMETHASONE SOD PHOSPHATE PF 10 MG/ML IJ SOLN
INTRAMUSCULAR | Status: AC
  Filled 2024-12-19: qty 1

## 2024-12-19 MED ORDER — MENTHOL 3 MG MT LOZG: 1.0000 | LOZENGE | OROMUCOSAL | Status: DC | PRN

## 2024-12-19 MED ORDER — PANTOPRAZOLE SODIUM 40 MG PO TBEC
40.0000 mg | DELAYED_RELEASE_TABLET | Freq: Every day | ORAL | Status: DC
  Administered 2024-12-20: 40 mg via ORAL
  Filled 2024-12-19: qty 1

## 2024-12-19 MED ORDER — OXYCODONE HCL 5 MG PO TABS
ORAL_TABLET | ORAL | Status: AC
  Filled 2024-12-19: qty 1

## 2024-12-19 MED ORDER — POVIDONE-IODINE 10 % EX SWAB
2.0000 | Freq: Once | CUTANEOUS | Status: AC
  Administered 2024-12-19: 2 via TOPICAL

## 2024-12-19 MED ORDER — BUPIVACAINE-EPINEPHRINE (PF) 0.25% -1:200000 IJ SOLN
INTRAMUSCULAR | Status: DC | PRN
  Administered 2024-12-19: 30 mL via PERINEURAL

## 2024-12-19 MED ORDER — PHENYLEPHRINE HCL-NACL 20-0.9 MG/250ML-% IV SOLN
INTRAVENOUS | Status: AC
  Filled 2024-12-19: qty 750

## 2024-12-19 MED ORDER — CELECOXIB 200 MG PO CAPS
200.0000 mg | ORAL_CAPSULE | Freq: Once | ORAL | Status: AC
  Administered 2024-12-19: 200 mg via ORAL
  Filled 2024-12-19: qty 1

## 2024-12-19 NOTE — Discharge Instructions (Addendum)
Frank Aluisio, MD Total Joint Specialist [facility] [address] [place], [place] [address] [phone]  ANTERIOR APPROACH TOTAL HIP REPLACEMENT POSTOPERATIVE DIRECTIONS     Hip Rehabilitation, Guidelines Following Surgery  The results of a hip operation are greatly improved after range of motion and muscle strengthening exercises. Follow all safety measures which are given to protect your hip. If any of these exercises cause increased pain or swelling in your joint, decrease the amount until you are comfortable again. Then slowly increase the exercises. Call your caregiver if you have problems or questions.   BLOOD CLOT PREVENTION Take an 81 mg Aspirin two times a day for three weeks following surgery. Then take an 81 mg Aspirin once a day for three weeks. Then discontinue Aspirin. You may resume your vitamins/supplements upon discharge from the hospital. Do not take any NSAIDs (Advil, Aleve, Ibuprofen, Meloxicam, etc.) until you are 3 weeks out from surgery  HOME CARE INSTRUCTIONS  Remove items at home which could result in a fall. This includes throw rugs or furniture in walking pathways.  ICE to the affected hip as frequently as 20-30 minutes an hour and then as needed for pain and swelling. Continue to use ice on the hip for pain and swelling from surgery. You may notice swelling that will progress down to the foot and ankle. This is normal after surgery. Elevate the leg when you are not up walking on it.   Continue to use the breathing machine which will help keep your temperature down.  It is common for your temperature to cycle up and down following surgery, especially at night when you are not up moving around and exerting yourself.  The breathing machine keeps your lungs expanded and your temperature down.  DIET You may resume your previous home diet once your are discharged from the hospital.  DRESSING / WOUND CARE / SHOWERING You have an  adhesive waterproof bandage over the incision. Leave this in place until your first follow-up appointment. Once you remove this you will not need to place another bandage.  You may begin showering 3 days following surgery, but do not submerge the incision under water.  ACTIVITY For the first 3-5 days, it is important to rest and keep the operative leg elevated. You should, as a general rule, rest for 50 minutes and walk/stretch for 10 minutes per hour. After 5 days, you may slowly increase activity as tolerated.  Perform the exercises you were provided twice a day for about 15-20 minutes each session. Begin these 2 days following surgery. Walk with your walker as instructed. Use the walker until you are comfortable transitioning to a cane. Walk with the cane in the opposite hand of the operative leg. You may discontinue the cane once you are comfortable and walking steadily. Avoid periods of inactivity such as sitting longer than an hour when not asleep. This helps prevent blood clots.  Do not drive a car for 6 weeks or until released by your surgeon.  Do not drive while taking narcotics.  TED HOSE STOCKINGS Wear the elastic stockings on both legs for three weeks following surgery during the day. You may remove them at night while sleeping.  WEIGHT BEARING Weight bearing as tolerated with assist device (walker, cane, etc) as directed, use it as long as suggested by your surgeon or therapist, typically at least 4-6 weeks.  POSTOPERATIVE CONSTIPATION PROTOCOL Constipation - defined medically as fewer than three stools per week and severe constipation as   less than one stool per week.  One of the most common issues patients have following surgery is constipation.  Even if you have a regular bowel pattern at home, your normal regimen is likely to be disrupted due to multiple reasons following surgery.  Combination of anesthesia, postoperative narcotics, change in appetite and fluid intake all can  affect your bowels.  In order to avoid complications following surgery, here are some recommendations in order to help you during your recovery period.  Colace (docusate) - Pick up an over-the-counter form of Colace or another stool softener and take twice a day as long as you are requiring postoperative pain medications.  Take with a full glass of water daily.  If you experience loose stools or diarrhea, hold the colace until you stool forms back up.  If your symptoms do not get better within 1 week or if they get worse, check with your doctor. Dulcolax (bisacodyl) - Pick up over-the-counter and take as directed by the product packaging as needed to assist with the movement of your bowels.  Take with a full glass of water.  Use this product as needed if not relieved by Colace only.  MiraLax (polyethylene glycol) - Pick up over-the-counter to have on hand.  MiraLax is a solution that will increase the amount of water in your bowels to assist with bowel movements.  Take as directed and can mix with a glass of water, juice, soda, coffee, or tea.  Take if you go more than two days without a movement.Do not use MiraLax more than once per day. Call your doctor if you are still constipated or irregular after using this medication for 7 days in a row.  If you continue to have problems with postoperative constipation, please contact the office for further assistance and recommendations.  If you experience "the worst abdominal pain ever" or develop nausea or vomiting, please contact the office immediatly for further recommendations for treatment.  ITCHING  If you experience itching with your medications, try taking only a single pain pill, or even half a pain pill at a time.  You can also use Benadryl over the counter for itching or also to help with sleep.   MEDICATIONS See your medication summary on the "After Visit Summary" that the nursing staff will review with you prior to discharge.  You may have some home  medications which will be placed on hold until you complete the course of blood thinner medication.  It is important for you to complete the blood thinner medication as prescribed by your surgeon.  Continue your approved medications as instructed at time of discharge.  PRECAUTIONS If you experience chest pain or shortness of breath - call 911 immediately for transfer to the hospital emergency department.  If you develop a fever greater that 101 F, purulent drainage from wound, increased redness or drainage from wound, foul odor from the wound/dressing, or calf pain - CONTACT YOUR SURGEON.                                                   FOLLOW-UP APPOINTMENTS Make sure you keep all of your appointments after your operation with your surgeon and caregivers. You should call the office at the above phone number and make an appointment for approximately two weeks after the date of your surgery or on the   date instructed by your surgeon outlined in the "After Visit Summary".  RANGE OF MOTION AND STRENGTHENING EXERCISES  These exercises are designed to help you keep full movement of your hip joint. Follow your caregiver's or physical therapist's instructions. Perform all exercises about fifteen times, three times per day or as directed. Exercise both hips, even if you have had only one joint replacement. These exercises can be done on a training (exercise) mat, on the floor, on a table or on a bed. Use whatever works the best and is most comfortable for you. Use music or television while you are exercising so that the exercises are a pleasant break in your day. This will make your life better with the exercises acting as a break in routine you can look forward to.  Lying on your back, slowly slide your foot toward your buttocks, raising your knee up off the floor. Then slowly slide your foot back down until your leg is straight again.  Lying on your back spread your legs as far apart as you can without causing  discomfort.  Lying on your side, raise your upper leg and foot straight up from the floor as far as is comfortable. Slowly lower the leg and repeat.  Lying on your back, tighten up the muscle in the front of your thigh (quadriceps muscles). You can do this by keeping your leg straight and trying to raise your heel off the floor. This helps strengthen the largest muscle supporting your knee.  Lying on your back, tighten up the muscles of your buttocks both with the legs straight and with the knee bent at a comfortable angle while keeping your heel on the floor.   POST-OPERATIVE OPIOID TAPER INSTRUCTIONS: It is important to wean off of your opioid medication as soon as possible. If you do not need pain medication after your surgery it is ok to stop day one. Opioids include: Codeine, Hydrocodone(Norco, Vicodin), Oxycodone(Percocet, oxycontin) and hydromorphone amongst others.  Long term and even short term use of opiods can cause: Increased pain response Dependence Constipation Depression Respiratory depression And more.  Withdrawal symptoms can include Flu like symptoms Nausea, vomiting And more Techniques to manage these symptoms Hydrate well Eat regular healthy meals Stay active Use relaxation techniques(deep breathing, meditating, yoga) Do Not substitute Alcohol to help with tapering If you have been on opioids for less than two weeks and do not have pain than it is ok to stop all together.  Plan to wean off of opioids This plan should start within one week post op of your joint replacement. Maintain the same interval or time between taking each dose and first decrease the dose.  Cut the total daily intake of opioids by one tablet each day Next start to increase the time between doses. The last dose that should be eliminated is the evening dose.   IF YOU ARE TRANSFERRED TO A SKILLED REHAB FACILITY If the patient is transferred to a skilled rehab facility following release from the  hospital, a list of the current medications will be sent to the facility for the patient to continue.  When discharged from the skilled rehab facility, please have the facility set up the patient's Home Health Physical Therapy prior to being released. Also, the skilled facility will be responsible for providing the patient with their medications at time of release from the facility to include their pain medication, the muscle relaxants, and their blood thinner medication. If the patient is still at the rehab facility   at time of the two week follow up appointment, the skilled rehab facility will also need to assist the patient in arranging follow up appointment in our office and any transportation needs.  MAKE SURE YOU:  Understand these instructions.  Get help right away if you are not doing well or get worse.    DENTAL ANTIBIOTICS:  In most cases prophylactic antibiotics for Dental procdeures after total joint surgery are not necessary.  Exceptions are as follows:  1. History of prior total joint infection  2. Severely immunocompromised (Organ Transplant, cancer chemotherapy, Rheumatoid biologic meds such as Humera)  3. Poorly controlled diabetes (A1C &gt; 8.0, blood glucose over 200)  If you have one of these conditions, contact your surgeon for an antibiotic prescription, prior to your dental procedure.    Pick up stool softner and laxative for home use following surgery while on pain medications. Do not submerge incision under water. Please use good hand washing techniques while changing dressing each day. May shower starting three days after surgery. Please use a clean towel to pat the incision dry following showers. Continue to use ice for pain and swelling after surgery. Do not use any lotions or creams on the incision until instructed by your surgeon.  

## 2024-12-19 NOTE — Interval H&P Note (Signed)
 History and Physical Interval Note:  12/19/2024 6:31 AM  Adrian Byrd  has presented today for surgery, with the diagnosis of Left hip osteoarthritis.  The various methods of treatment have been discussed with the patient and family. After consideration of risks, benefits and other options for treatment, the patient has consented to  Procedures: ARTHROPLASTY, HIP, TOTAL, ANTERIOR APPROACH (Left) as a surgical intervention.  The patient's history has been reviewed, patient examined, no change in status, stable for surgery.  I have reviewed the patient's chart and labs.  Questions were answered to the patient's satisfaction.     [name]

## 2024-12-19 NOTE — Evaluation (Signed)
 Physical Therapy Evaluation Patient Details Name: Adrian Byrd MRN: [id number] DOB: Dec 24, 1950 Today's Date: 12/19/2024  History of Present Illness  Pt is 74 yo male s/p L anterior THA on 12/19/24.  Pt with hx including but not limited to back pain with sx x 5, neck sx x 3, and arthritis  Clinical Impression  Pt is s/p THA resulting in the deficits listed below (see PT Problem List). At baseline, pt independent.  He has home support and small entry steps at home.  Pt very motivated and with good pain control.  He was able to ambulate 20' with RW and CGA.  Pt expected to progress very well with therapy.  Noting plan for home with HEP only.  Pt will benefit from acute skilled PT to increase their independence and safety with mobility to facilitate discharge.          If plan is discharge home, recommend the following: A little help with walking and/or transfers;Assistance with cooking/housework;A little help with bathing/dressing/bathroom;Help with stairs or ramp for entrance   Can travel by private vehicle        Equipment Recommendations Rolling walker (2 wheels)  Recommendations for Other Services       Functional Status Assessment Patient has had a recent decline in their functional status and demonstrates the ability to make significant improvements in function in a reasonable and predictable amount of time.     Precautions / Restrictions Precautions Precautions: Fall Restrictions Weight Bearing Restrictions Per Provider Order: Yes LLE Weight Bearing Per Provider Order: Weight bearing as tolerated      Mobility  Bed Mobility Overal bed mobility: Needs Assistance Bed Mobility: Supine to Sit     Supine to sit: Min assist          Transfers Overall transfer level: Needs assistance Equipment used: Rolling walker (2 wheels) Transfers: Sit to/from Stand Sit to Stand: Contact guard assist           General transfer comment: cues for hand placement     Ambulation/Gait Ambulation/Gait assistance: Contact guard assist Gait Distance (Feet): 80 Feet Assistive device: Rolling walker (2 wheels) Gait Pattern/deviations: Step-through pattern, Decreased stride length, Trunk flexed Gait velocity: decreased but functional     General Gait Details: Only slight decrease in weight shift to L; mod cues for RW proximity and posture  Stairs            Wheelchair Mobility     Tilt Bed    Modified Rankin (Stroke Patients Only)       Balance Overall balance assessment: Needs assistance Sitting-balance support: No upper extremity supported Sitting balance-Leahy Scale: Good     Standing balance support: No upper extremity supported, Bilateral upper extremity supported Standing balance-Leahy Scale: Fair Standing balance comment: RW to ambulate - steady; could static stand without support                             Pertinent Vitals/Pain Pain Assessment Pain Assessment: No/denies pain    Home Living Family/patient expects to be discharged to:: Private residence Living Arrangements: Spouse/significant other Available Help at Discharge: Family;Available 24 hours/day Type of Home: House Home Access: Stairs to enter Entrance Stairs-Rails: None Entrance Stairs-Number of Steps: 2-3 step up onto porch then small threshold   Home Layout: One level Home Equipment: Toilet riser;Cane - single point Additional Comments: Got a bariatric RW at [employer]    Prior Function Prior Level of Function :  Independent/Modified Independent;Driving             Mobility Comments: Could ambulate in community withotu AD       Extremity/Trunk Assessment   Upper Extremity Assessment Upper Extremity Assessment: Overall WFL for tasks assessed    Lower Extremity Assessment Lower Extremity Assessment: LLE deficits/detail;RLE deficits/detail RLE Deficits / Details: ROM WFL; MMT 5/5 LLE Deficits / Details: Expected post op changes;  ROM WFL; MMT: ankle 5/5, knee ext 3/5 not futher tested, hip 1/5    Cervical / Trunk Assessment Cervical / Trunk Assessment: Normal  Communication        Cognition Arousal: Alert Behavior During Therapy: WFL for tasks assessed/performed   PT - Cognitive impairments: No apparent impairments                       PT - Cognition Comments: Pleasant and motivated         Cueing       General Comments General comments (skin integrity, edema, etc.): encouraged ankle pumps  Educated on safe ice use, no pivots, car transfers, and TED hose during day. Also, encouraged walking every 1-2 hours during day for 5-10 mins. Educated on HEP with focus on mobility the first week. Discussed doing exercises within pain control and if pain increasing could decreased ROM, reps, and stop exercises as needed. Encouraged to perform ankle pumps frequently for blood flow.   Exercises     Assessment/Plan    PT Assessment Patient needs continued PT services  PT Problem List Decreased strength;Decreased range of motion;Decreased activity tolerance;Decreased balance;Decreased mobility;Decreased knowledge of use of DME       PT Treatment Interventions DME instruction;Therapeutic exercise;Gait training;Stair training;Functional mobility training;Therapeutic activities;Patient/family education;Balance training;Modalities    PT Goals (Current goals can be found in the Care Plan section)  Acute Rehab PT Goals Patient Stated Goal: return home - back to normal activiites, 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 PT Goal Formulation: With patient/family Time For Goal Achievement: 01/02/25 Potential to Achieve Goals: Good    Frequency 7X/week     Co-evaluation               AM-PAC PT 6 Clicks Mobility  Outcome Measure Help needed turning from your back to your side while in a flat bed without using bedrails?: A Little Help needed moving from lying on your back to sitting on the side of a flat bed  without using bedrails?: A Little Help needed moving to and from a bed to a chair (including a wheelchair)?: A Little Help needed standing up from a chair using your arms (e.g., wheelchair or bedside chair)?: A Little Help needed to walk in hospital room?: A Little Help needed climbing 3-5 steps with a railing? : A Little 6 Click Score: 18    End of Session Equipment Utilized During Treatment: Gait belt Activity Tolerance: Patient tolerated treatment well Patient left: in chair;with chair alarm set;with call bell/phone within reach;with family/visitor present Nurse Communication: Mobility status PT Visit Diagnosis: Other abnormalities of gait and mobility (R26.89);Muscle weakness (generalized) (M62.81)    Time: 8566-8492 PT Time Calculation (min) (ACUTE ONLY): 34 min   Charges:   PT Evaluation $PT Eval Low Complexity: 1 Low PT Treatments $Gait Training: 8-22 mins PT General Charges $$ ACUTE PT VISIT: 1 Visit         Benjiman, PT Acute Rehab [facility] [phone]   Benjiman VEAR Mulberry 12/19/2024, 4:09 PM

## 2024-12-19 NOTE — Transfer of Care (Signed)
 Immediate Anesthesia Transfer of Care Note  Patient: Adrian Byrd  Procedure(s) Performed: ARTHROPLASTY, HIP, TOTAL, ANTERIOR APPROACH (Left: Hip)  Patient Location: PACU  Anesthesia Type:General  Level of Consciousness: sedated  Airway & Oxygen Therapy: Patient Spontanous Breathing and Patient connected to face mask oxygen  Post-op Assessment: Report given to RN and Post -op Vital signs reviewed and stable  Post vital signs: Reviewed and stable  Last Vitals:  Vitals Value Taken Time  BP    Temp    Pulse 86 12/19/24 09:55  Resp 19 12/19/24 09:55  SpO2 97 % 12/19/24 09:55  Vitals shown include unfiled device data.  Last Pain:  Vitals:   12/19/24 0644  TempSrc:   PainSc: 0-No pain         Complications: No notable events documented.

## 2024-12-19 NOTE — Op Note (Signed)
 "     OPERATIVE REPORT- TOTAL HIP ARTHROPLASTY   PREOPERATIVE DIAGNOSIS: Osteoarthritis of the Left hip.   POSTOPERATIVE DIAGNOSIS: Osteoarthritis of the Left  hip.   PROCEDURE: Left total hip arthroplasty, anterior approach.   SURGEON: Dempsey Moan, MD   ASSISTANT: Waddell Sor, PA-C  ANESTHESIA:  General  ESTIMATED BLOOD LOSS:-250 mL    DRAINS: None  COMPLICATIONS: None   CONDITION: PACU - hemodynamically stable.   BRIEF CLINICAL NOTE: Adrian Byrd is a 74 y.o. male who has advanced end-  stage arthritis of their Left  hip with progressively worsening pain and  dysfunction.The patient has failed nonoperative management and presents for  total hip arthroplasty.   PROCEDURE IN DETAIL: After successful administration of spinal  anesthetic, the traction boots for the [facility] bed were placed on both  feet and the patient was placed onto the [facility] bed, boots placed into the leg  holders. The Left hip was then isolated from the perineum with plastic  drapes and prepped and draped in the usual sterile fashion. ASIS and  greater trochanter were marked and a oblique incision was made, starting  at about 1 cm lateral and 2 cm distal to the ASIS and coursing towards  the anterior cortex of the femur. The skin was cut with a 10 blade  through subcutaneous tissue to the level of the fascia overlying the  tensor fascia lata muscle. The fascia was then incised in line with the  incision at the junction of the anterior third and posterior 2/3rd. The  muscle was teased off the fascia and then the interval between the TFL  and the rectus was developed. The Hohmann retractor was then placed at  the top of the femoral neck over the capsule. The vessels overlying the  capsule were cauterized and the fat on top of the capsule was removed.  A Hohmann retractor was then placed anterior underneath the rectus  femoris to give exposure to the entire anterior capsule. A T-shaped  capsulotomy was  performed. The edges were tagged and the femoral head  was identified.       Osteophytes are removed off the superior acetabulum.  The femoral neck was then cut in situ with an oscillating saw. Traction  was then applied to the left lower extremity utilizing the [facility]  traction. The femoral head was then removed. Retractors were placed  around the acetabulum and then circumferential removal of the labrum was  performed. Osteophytes were also removed. Reaming starts at 49 mm to  medialize and  Increased in 2 mm increments to 53 mm. We reamed in  approximately 40 degrees of abduction, 20 degrees anteversion. A 54 mm  pinnacle acetabular shell was then impacted in anatomic position under  fluoroscopic guidance with excellent purchase. We did not need to place  any additional dome screws. A 36 mm neutral + 4 Altrx liner was then  placed into the acetabular shell.       The femoral lift was then placed along the lateral aspect of the femur  just distal to the vastus ridge. The leg was  externally rotated and capsule  was stripped off the inferior aspect of the femoral neck down to the  level of the lesser trochanter, this was done with electrocautery. The femur was lifted after this was performed. The  leg was then placed in an extended and adducted position essentially delivering the femur. We also removed the capsule superiorly and the piriformis from the piriformis fossa to  gain excellent exposure of the  proximal femur. Rongeur was used to remove some cancellous bone to get  into the lateral portion of the proximal femur for placement of the  initial starter reamer. The starter broaches was placed  the starter broach  and was shown to go down the center of the canal. Broaching  with the Actis system was then performed starting at size 0  coursing  Up to size 5. A size 5 had excellent torsional and rotational  and axial stability. The trial standard offset neck was then placed  with a 36 + 5  trial head. The hip was then reduced. We confirmed that  the stem was in the canal both on AP and lateral x-rays. It also has excellent sizing. The hip was reduced with outstanding stability through full extension and full external rotation.. AP pelvis was taken and the leg lengths were measured and found to be equal. Hip was then dislocated again and the femoral head and neck removed. The  femoral broach was removed. Size 5 Actis stem with a standard offset  neck was then impacted into the femur following native anteversion. Has  excellent purchase in the canal. Excellent torsional and rotational and  axial stability. It is confirmed to be in the canal on AP and lateral  fluoroscopic views. The 36 + 5 ceramic head was placed and the hip  reduced with outstanding stability. Again AP pelvis was taken and it  confirmed that the leg lengths were equal. The wound was then copiously  irrigated with saline solution and the capsule reattached and repaired  with Ethibond suture. 30 ml of .25% Bupivicaine was  injected into the capsule and into the edge of the tensor fascia lata as well as subcutaneous tissue. The fascia overlying the tensor fascia lata was then closed with a running #1 V-Loc. Subcu was closed with interrupted 2-0 Vicryl and subcuticular running 4-0 Monocryl. Incision was cleaned  and dried. Steri-Strips and a bulky sterile dressing applied. The patient was awakened and transported to  recovery in stable condition.        Please note that a surgical assistant was a medical necessity for this procedure to perform it in a safe and expeditious manner. Assistant was necessary to provide appropriate retraction of vital neurovascular structures and to prevent femoral fracture and allow for anatomic placement of the prosthesis.  Dempsey Moan, M.D.    "

## 2024-12-19 NOTE — Anesthesia Procedure Notes (Signed)
 Procedure Name: Intubation Date/Time: 12/19/2024 8:26 AM  Performed by: Carleton Garnette SAUNDERS, CRNAPre-anesthesia Checklist: Patient identified, Emergency Drugs available, Suction available, Patient being monitored and Timeout performed Patient Re-evaluated:Patient Re-evaluated prior to induction Oxygen Delivery Method: Circle system utilized Preoxygenation: Pre-oxygenation with 100% oxygen Induction Type: IV induction Ventilation: Mask ventilation without difficulty Laryngoscope Size: Mac and 4 Grade View: Grade I Tube type: Oral Tube size: 7.5 mm Number of attempts: 1 Airway Equipment and Method: Stylet Placement Confirmation: ETT inserted through vocal cords under direct vision, positive ETCO2 and breath sounds checked- equal and bilateral Secured at: 22 cm Tube secured with: Tape Dental Injury: Teeth and Oropharynx as per pre-operative assessment

## 2024-12-19 NOTE — Anesthesia Postprocedure Evaluation (Signed)
"   Anesthesia Post Note  Patient: Adrian Byrd  Procedure(s) Performed: ARTHROPLASTY, HIP, TOTAL, ANTERIOR APPROACH (Left: Hip)     Patient location during evaluation: PACU Anesthesia Type: General Level of consciousness: awake and alert Pain management: pain level controlled Vital Signs Assessment: post-procedure vital signs reviewed and stable Respiratory status: spontaneous breathing, nonlabored ventilation, respiratory function stable and patient connected to nasal cannula oxygen Cardiovascular status: blood pressure returned to baseline and stable Postop Assessment: no apparent nausea or vomiting Anesthetic complications: no   No notable events documented.  Last Vitals:  Vitals:   12/19/24 1215 12/19/24 1244  BP: (!) 125/58 (!) 126/58  Pulse: 79 81  Resp: 11 19  Temp:  (!) 36.3 C  SpO2: 98% 92%    Last Pain:  Vitals:   12/19/24 1244  TempSrc: Oral  PainSc: 3                  Nahmir Zeidman      "

## 2024-12-20 ENCOUNTER — Other Ambulatory Visit (HOSPITAL_COMMUNITY): Payer: Self-pay

## 2024-12-20 ENCOUNTER — Encounter (HOSPITAL_COMMUNITY): Payer: Self-pay | Admitting: Orthopedic Surgery

## 2024-12-20 DIAGNOSIS — M1612 Unilateral primary osteoarthritis, left hip: Secondary | ICD-10-CM | POA: Diagnosis not present

## 2024-12-20 LAB — CBC
HCT: 40.1 % (ref 39.0–52.0)
Hemoglobin: 13.3 g/dL (ref 13.0–17.0)
MCH: 31.2 pg (ref 26.0–34.0)
MCHC: 33.2 g/dL (ref 30.0–36.0)
MCV: 94.1 fL (ref 80.0–100.0)
Platelets: 209 K/uL (ref 150–400)
RBC: 4.26 MIL/uL (ref 4.22–5.81)
RDW: 13.2 % (ref 11.5–15.5)
WBC: 16.6 K/uL — ABNORMAL HIGH (ref 4.0–10.5)
nRBC: 0 % (ref 0.0–0.2)

## 2024-12-20 LAB — BASIC METABOLIC PANEL WITH GFR
Anion gap: 9 (ref 5–15)
BUN: 15 mg/dL (ref 8–23)
CO2: 23 mmol/L (ref 22–32)
Calcium: 9.1 mg/dL (ref 8.9–10.3)
Chloride: 112 mmol/L — ABNORMAL HIGH (ref 98–111)
Creatinine, Ser: 0.87 mg/dL (ref 0.61–1.24)
GFR, Estimated: >60 mL/min
Glucose, Bld: 112 mg/dL — ABNORMAL HIGH (ref 70–99)
Potassium: 5 mmol/L (ref 3.5–5.1)
Sodium: 143 mmol/L (ref 135–145)

## 2024-12-20 MED ORDER — ONDANSETRON HCL 4 MG PO TABS
4.0000 mg | ORAL_TABLET | Freq: Four times a day (QID) | ORAL | 0 refills | Status: AC | PRN
  Filled 2024-12-20: qty 20, 5d supply, fill #0

## 2024-12-20 MED ORDER — HYDROCODONE-ACETAMINOPHEN 5-325 MG PO TABS
1.0000 | ORAL_TABLET | ORAL | 0 refills | Status: AC | PRN
  Filled 2024-12-20: qty 42, 7d supply, fill #0

## 2024-12-20 MED ORDER — ASPIRIN 81 MG PO CHEW
81.0000 mg | CHEWABLE_TABLET | Freq: Two times a day (BID) | ORAL | 0 refills | Status: AC
  Filled 2024-12-20: qty 63, 32d supply, fill #0

## 2024-12-20 MED ORDER — TRAMADOL HCL 50 MG PO TABS
50.0000 mg | ORAL_TABLET | Freq: Four times a day (QID) | ORAL | 0 refills | Status: AC | PRN
  Filled 2024-12-20: qty 40, 5d supply, fill #0

## 2024-12-20 MED ORDER — METHOCARBAMOL 500 MG PO TABS
500.0000 mg | ORAL_TABLET | Freq: Four times a day (QID) | ORAL | 0 refills | Status: AC | PRN
  Filled 2024-12-20: qty 40, 10d supply, fill #0
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
## 2024-12-20 NOTE — Progress Notes (Signed)
 Physical Therapy Treatment Patient Details Name: Adrian Byrd MRN: [id number] DOB: [birth year] Today's Date: 12/20/2024   History of Present Illness Pt is 74 yo male s/p L anterior THA on 12/19/24.  Pt with hx including but not limited to back pain with sx x 5, neck sx x 3, and arthritis    PT Comments  Pt pleasant, slightly impulsive and education on safe speed, hand placement, DME use and pacing. Pt performs transfers supv to modified ind with RW, good carryover with safety cues for hand placement and RW management. Pt able to amb into restroom, stand to void bladder then amb back to recliner. Pt tolerates seated exercises, provided written/illustrated HEP, good muscle activation and motor control, all questions answered to pt and spouse at bedside. Pt then amb out into hallway and completes 6 curb training as pt has 3 threshold to enter home, able to complete with correct sequencing while using RW, supv for safety. Pt amb 150 ft with supv, using RW, cues for posture with minimal improvement, step through gait pattern with BUE assisting on RW to decrease LLE weightbearing. Pt with initial pain 5/10 in standing, improves to 3/10 after exercises and ambulation. Spouse present for entire session, all questions answered, pt reports will have good family support and ready to d/c home.   If plan is discharge home, recommend the following: A little help with walking and/or transfers;Assistance with cooking/housework;A little help with bathing/dressing/bathroom;Help with stairs or ramp for entrance;Assist for transportation   Can travel by private vehicle        Equipment Recommendations  Rolling walker (2 wheels)    Recommendations for Other Services       Precautions / Restrictions Precautions Precautions: Fall Recall of Precautions/Restrictions: Intact Restrictions Weight Bearing Restrictions Per Provider Order: Yes LLE Weight Bearing Per Provider Order: Weight bearing as tolerated      Mobility  Bed Mobility               General bed mobility comments: in recliner upon arrival    Transfers Overall transfer level: Needs assistance Equipment used: Rolling walker (2 wheels) Transfers: Sit to/from Stand Sit to Stand: Supervision, Modified independent (Device/Increase time)           General transfer comment: initial transfer cues for hand placment and RW positioning, slightly impulsive; modified ind for following reps with good carryover    Ambulation/Gait Ambulation/Gait assistance: Supervision Gait Distance (Feet): 150 Feet Assistive device: Rolling walker (2 wheels) Gait Pattern/deviations: Step-through pattern, Decreased stride length, Trunk flexed, Decreased weight shift to left Gait velocity: decreased but functional     General Gait Details: step through gait pattern, trunk forward flexed improves minimally with verbal cues, strong BUE on RW decreasing LLE weightbearing, initial cues to maintain body within RW frame and for technique with turns with good carryover, supv   Stairs Stairs: Yes Stairs assistance: Supervision Stair Management: Forwards, With walker Number of Stairs: 1 (6 curb x3) General stair comments: pt ascends/descends 6 curb 3x with RW, able to maintain correct sequencing, supv for safety, spouse present for step/threshold training   Wheelchair Mobility     Tilt Bed    Modified Rankin (Stroke Patients Only)       Balance Overall balance assessment: Needs assistance Sitting-balance support: No upper extremity supported Sitting balance-Leahy Scale: Good     Standing balance support: No upper extremity supported, Bilateral upper extremity supported Standing balance-Leahy Scale: Fair Standing balance comment: static without UE support, dynamic wtih RW  Communication Communication Communication: No apparent difficulties  Cognition Arousal: Alert Behavior During Therapy:  WFL for tasks assessed/performed   PT - Cognitive impairments: No apparent impairments                       PT - Cognition Comments: Pt slightly impulsive, responds well to education and encouragement Following commands: Intact      Cueing    Exercises Total Joint Exercises Ankle Circles/Pumps: AROM, Both, 5 reps, Seated (long sitting in recliner) Quad Sets: AROM, Both, 5 reps, Seated (long sittig in recliner) Short Arc Quad: AROM, Left, 5 reps, Seated (long sitting in recliner) Heel Slides: AROM, Left, 5 reps, Seated (long sitting in recliner) Hip ABduction/ADduction: AROM, Left, 5 reps, Seated (long sitting in recliner) Long Arc Quad: AROM, Left, 5 reps, Seated    General Comments        Pertinent Vitals/Pain Pain Assessment Pain Assessment: 0-10 Pain Score: 3  Pain Location: L hip Pain Descriptors / Indicators: Discomfort, Sore Pain Intervention(s): Limited activity within patient's tolerance, Monitored during session, Premedicated before session, Repositioned, Ice applied    Home Living                          Prior Function            PT Goals (current goals can now be found in the care plan section) Acute Rehab PT Goals Patient Stated Goal: return home - back to normal activiites, 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 PT Goal Formulation: With patient/family Time For Goal Achievement: 01/02/25 Potential to Achieve Goals: Good Progress towards PT goals: Progressing toward goals    Frequency    7X/week      PT Plan      Co-evaluation              AM-PAC PT 6 Clicks Mobility   Outcome Measure  Help needed turning from your back to your side while in a flat bed without using bedrails?: A Little Help needed moving from lying on your back to sitting on the side of a flat bed without using bedrails?: A Little Help needed moving to and from a bed to a chair (including a wheelchair)?: None Help needed standing up from a chair using your  arms (e.g., wheelchair or bedside chair)?: None Help needed to walk in hospital room?: A Little Help needed climbing 3-5 steps with a railing? : A Little 6 Click Score: 20    End of Session Equipment Utilized During Treatment: Gait belt Activity Tolerance: Patient tolerated treatment well Patient left: in chair;with chair alarm set;with call bell/phone within reach;with family/visitor present Nurse Communication: Mobility status PT Visit Diagnosis: Other abnormalities of gait and mobility (R26.89);Muscle weakness (generalized) (M62.81)     Time: 9164-9069 PT Time Calculation (min) (ACUTE ONLY): 55 min  Charges:    $Gait Training: 23-37 mins $Therapeutic Exercise: 8-22 mins $Therapeutic Activity: 8-22 mins PT General Charges $$ ACUTE PT VISIT: 1 Visit                     Tori Jazzalynn Rhudy PT, DPT 12/20/2024, 9:48 AM

## 2024-12-20 NOTE — Care Management Obs Status (Signed)
 MEDICARE OBSERVATION STATUS NOTIFICATION   Patient Details  Name: Adrian Byrd MRN: [id number] Date of Birth: 1951/02/17   Medicare Observation Status Notification Given:  Yes    Alfonse JONELLE Rex, RN 12/20/2024, 9:53 AM

## 2024-12-20 NOTE — Progress Notes (Signed)
 Discharge medications delivered to patient at the bedside in a secure bag.

## 2024-12-20 NOTE — TOC Transition Note (Addendum)
 Transition of Care [facility]) - Discharge Note   Patient Details  Name: Adrian Byrd MRN: [id number] Date of Birth: [birth year]  [facility]) CM/SW Contact:  Alfonse JONELLE Rex, RN Phone Number: 12/20/2024, 9:38 AM   Clinical Narrative:  Admitted 12/19/24 for scheduled  L THA, dc therapy HEP,  post dc PCP appt scheduled for 01/03/25 at 11:00am, added to AVS. MOON completed.  No INPT CM needs.    -111:30am RW ( from [employer] equipment closed) delivered to bedside by NCM.     Final next level of care: Home/Self Care Barriers to Discharge: No Barriers Identified   Patient Goals and CMS Choice Patient states their goals for this hospitalization and ongoing recovery are:: return home          Discharge Placement                       Discharge Plan and Services Additional resources added to the After Visit Summary for                                       Social Drivers of Health (SDOH) Interventions SDOH Screenings   Food Insecurity: No Food Insecurity (12/19/2024)  Housing: Low Risk (12/19/2024)  Transportation Needs: No Transportation Needs (12/19/2024)  Utilities: Not At Risk (12/19/2024)  Financial Resource Strain: Low Risk (06/03/2024)   Received from [facility]  Physical Activity: Unknown (06/03/2024)   Received from [facility]  Social Connections: Moderately Integrated (12/19/2024)  Stress: No Stress Concern Present (06/03/2024)   Received from [facility]  Tobacco Use: Medium Risk (12/19/2024)     Readmission Risk Interventions     No data to display

## 2024-12-20 NOTE — Plan of Care (Signed)
" °  Problem: Education: Goal: Knowledge of the prescribed therapeutic regimen will improve Outcome: Progressing   Problem: Bowel/Gastric: Goal: Gastrointestinal status for postoperative course will improve Outcome: Progressing   Problem: Cardiac: Goal: Ability to maintain an adequate cardiac output Outcome: Progressing Goal: Will show no evidence of cardiac arrhythmias Outcome: Progressing   Problem: Nutritional: Goal: Will attain and maintain optimal nutritional status Outcome: Progressing   Problem: Neurological: Goal: Will regain or maintain usual level of consciousness Outcome: Progressing   Problem: Clinical Measurements: Goal: Ability to maintain clinical measurements within normal limits Outcome: Progressing Goal: Postoperative complications will be avoided or minimized Outcome: Progressing   Problem: Respiratory: Goal: Will regain and/or maintain adequate ventilation Outcome: Progressing Goal: Respiratory status will improve Outcome: Progressing   Problem: Skin Integrity: Goal: Demonstrates signs of wound healing without infection Outcome: Progressing   Problem: Urinary Elimination: Goal: Will remain free from infection Outcome: Progressing Goal: Ability to achieve and maintain adequate urine output Outcome: Progressing   Problem: Education: Goal: Knowledge of General Education information will improve Description: Including pain rating scale, medication(s)/side effects and non-pharmacologic comfort measures Outcome: Progressing   Problem: Health Behavior/Discharge Planning: Goal: Ability to manage health-related needs will improve Outcome: Progressing   Problem: Clinical Measurements: Goal: Ability to maintain clinical measurements within normal limits will improve Outcome: Progressing Goal: Will remain free from infection Outcome: Progressing Goal: Diagnostic test results will improve Outcome: Progressing Goal: Respiratory complications will  improve Outcome: Progressing Goal: Cardiovascular complication will be avoided Outcome: Progressing   Problem: Activity: Goal: Risk for activity intolerance will decrease Outcome: Progressing   Problem: Nutrition: Goal: Adequate nutrition will be maintained Outcome: Progressing   Problem: Coping: Goal: Level of anxiety will decrease Outcome: Progressing   Problem: Elimination: Goal: Will not experience complications related to bowel motility Outcome: Progressing Goal: Will not experience complications related to urinary retention Outcome: Progressing   Problem: Pain Managment: Goal: General experience of comfort will improve and/or be controlled Outcome: Progressing   Problem: Safety: Goal: Ability to remain free from injury will improve Outcome: Progressing   Problem: Skin Integrity: Goal: Risk for impaired skin integrity will decrease Outcome: Progressing   Problem: Education: Goal: Knowledge of the prescribed therapeutic regimen will improve Outcome: Progressing Goal: Understanding of discharge needs will improve Outcome: Progressing Goal: Individualized Educational Video(s) Outcome: Progressing   Problem: Activity: Goal: Ability to avoid complications of mobility impairment will improve Outcome: Progressing Goal: Ability to tolerate increased activity will improve Outcome: Progressing   Problem: Clinical Measurements: Goal: Postoperative complications will be avoided or minimized Outcome: Progressing   Problem: Pain Management: Goal: Pain level will decrease with appropriate interventions Outcome: Progressing   Problem: Skin Integrity: Goal: Will show signs of wound healing Outcome: Progressing   "
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
